# Patient Record
Sex: Female | Born: 1970 | Race: White | Hispanic: No | Marital: Married | Smoking: Former smoker
Health system: Southern US, Community
[De-identification: ages and names within clinical notes are randomized; demographics above are authoritative.]

## PROBLEM LIST (undated history)

## (undated) DIAGNOSIS — F419 Anxiety disorder, unspecified: Secondary | ICD-10-CM

## (undated) DIAGNOSIS — F329 Major depressive disorder, single episode, unspecified: Secondary | ICD-10-CM

## (undated) DIAGNOSIS — E7800 Pure hypercholesterolemia, unspecified: Secondary | ICD-10-CM

## (undated) DIAGNOSIS — G4730 Sleep apnea, unspecified: Secondary | ICD-10-CM

## (undated) DIAGNOSIS — F32A Depression, unspecified: Secondary | ICD-10-CM

## (undated) HISTORY — DX: Anxiety disorder, unspecified: F41.9

## (undated) HISTORY — PX: TUBAL LIGATION: SHX77

## (undated) HISTORY — DX: Major depressive disorder, single episode, unspecified: F32.9

## (undated) HISTORY — DX: Depression, unspecified: F32.A

---

## 1999-03-20 ENCOUNTER — Encounter: Admission: RE | Admit: 1999-03-20 | Discharge: 1999-06-18 | Payer: Self-pay | Admitting: Obstetrics & Gynecology

## 1999-08-15 ENCOUNTER — Other Ambulatory Visit: Admission: RE | Admit: 1999-08-15 | Discharge: 1999-08-15 | Payer: Self-pay | Admitting: Internal Medicine

## 1999-10-27 ENCOUNTER — Other Ambulatory Visit: Admission: RE | Admit: 1999-10-27 | Discharge: 1999-10-27 | Payer: Self-pay | Admitting: Obstetrics & Gynecology

## 2000-04-12 ENCOUNTER — Inpatient Hospital Stay (HOSPITAL_COMMUNITY): Admission: AD | Admit: 2000-04-12 | Discharge: 2000-04-14 | Payer: Self-pay | Admitting: Obstetrics & Gynecology

## 2000-04-15 ENCOUNTER — Encounter: Admission: RE | Admit: 2000-04-15 | Discharge: 2000-06-14 | Payer: Self-pay | Admitting: Obstetrics & Gynecology

## 2000-06-02 ENCOUNTER — Other Ambulatory Visit: Admission: RE | Admit: 2000-06-02 | Discharge: 2000-06-02 | Payer: Self-pay | Admitting: Obstetrics & Gynecology

## 2000-07-13 ENCOUNTER — Encounter: Admission: RE | Admit: 2000-07-13 | Discharge: 2000-08-12 | Payer: Self-pay | Admitting: Obstetrics & Gynecology

## 2000-08-13 ENCOUNTER — Encounter: Admission: RE | Admit: 2000-08-13 | Discharge: 2000-09-12 | Payer: Self-pay | Admitting: Obstetrics & Gynecology

## 2000-10-13 ENCOUNTER — Encounter: Admission: RE | Admit: 2000-10-13 | Discharge: 2000-11-12 | Payer: Self-pay | Admitting: Obstetrics & Gynecology

## 2000-11-13 ENCOUNTER — Encounter: Admission: RE | Admit: 2000-11-13 | Discharge: 2000-12-13 | Payer: Self-pay | Admitting: Obstetrics & Gynecology

## 2001-11-08 ENCOUNTER — Other Ambulatory Visit: Admission: RE | Admit: 2001-11-08 | Discharge: 2001-11-08 | Payer: Self-pay | Admitting: Obstetrics & Gynecology

## 2001-12-30 ENCOUNTER — Encounter (INDEPENDENT_AMBULATORY_CARE_PROVIDER_SITE_OTHER): Payer: Self-pay

## 2001-12-30 ENCOUNTER — Ambulatory Visit (HOSPITAL_COMMUNITY): Admission: RE | Admit: 2001-12-30 | Discharge: 2001-12-30 | Payer: Self-pay | Admitting: Obstetrics & Gynecology

## 2002-12-06 ENCOUNTER — Other Ambulatory Visit: Admission: RE | Admit: 2002-12-06 | Discharge: 2002-12-06 | Payer: Self-pay | Admitting: Obstetrics & Gynecology

## 2006-07-16 ENCOUNTER — Inpatient Hospital Stay (HOSPITAL_COMMUNITY): Admission: AD | Admit: 2006-07-16 | Discharge: 2006-07-16 | Payer: Self-pay | Admitting: Obstetrics & Gynecology

## 2006-09-21 ENCOUNTER — Encounter: Admission: RE | Admit: 2006-09-21 | Discharge: 2006-09-21 | Payer: Self-pay | Admitting: Obstetrics & Gynecology

## 2006-11-04 ENCOUNTER — Inpatient Hospital Stay (HOSPITAL_COMMUNITY): Admission: AD | Admit: 2006-11-04 | Discharge: 2006-11-08 | Payer: Self-pay | Admitting: Obstetrics & Gynecology

## 2006-11-05 ENCOUNTER — Encounter (INDEPENDENT_AMBULATORY_CARE_PROVIDER_SITE_OTHER): Payer: Self-pay | Admitting: Obstetrics & Gynecology

## 2006-11-10 ENCOUNTER — Inpatient Hospital Stay (HOSPITAL_COMMUNITY): Admission: AD | Admit: 2006-11-10 | Discharge: 2006-11-10 | Payer: Self-pay | Admitting: *Deleted

## 2008-06-28 ENCOUNTER — Ambulatory Visit (HOSPITAL_COMMUNITY): Admission: RE | Admit: 2008-06-28 | Discharge: 2008-06-28 | Payer: Self-pay | Admitting: Obstetrics & Gynecology

## 2008-06-28 ENCOUNTER — Encounter (INDEPENDENT_AMBULATORY_CARE_PROVIDER_SITE_OTHER): Payer: Self-pay | Admitting: Obstetrics & Gynecology

## 2010-06-18 LAB — PREGNANCY, URINE: Preg Test, Ur: NEGATIVE

## 2010-06-18 LAB — CBC
HCT: 33.5 % — ABNORMAL LOW (ref 36.0–46.0)
Platelets: 548 10*3/uL — ABNORMAL HIGH (ref 150–400)
RDW: 15.1 % (ref 11.5–15.5)

## 2010-07-22 NOTE — Op Note (Signed)
NAME:  Amanda Gamble, Amanda Gamble NO.:  [id number]   MEDICAL RECORD NO.:  [id number]           PATIENT TYPE:   LOCATION:                                FACILITY:  [facility]   PHYSICIAN:  Genia Del, M.D.DATE OF BIRTH:  Feb 19, 1971   DATE OF PROCEDURE:  11/05/2006  DATE OF DISCHARGE:                               OPERATIVE REPORT   PREOPERATIVE DIAGNOSIS:  36 plus weeks with gestational diabetes  mellitus, failure to descend in second stage, probable cephalopelvic  dystocia, clinical chorioamnionitis, desire for bilateral tubal  sterilization.   POSTOPERATIVE DIAGNOSIS:  36 plus weeks with gestational diabetes  mellitus, failure to descend in second stage, probable cephalopelvic  dystocia, clinical chorioamnionitis, desire for bilateral tubal  sterilization.   PROCEDURE:  Urgent primary low transverse cesarean section and bilateral  tubal sterilization by modified Pomeroy procedure.   SURGEON:  Genia Del, M.D.   ASSISTANT:  None.   ANESTHESIOLOGIST:  Raul Del, M.D.   DESCRIPTION OF PROCEDURE:  Under epidural anesthesia, the patient is in  15 degrees left decubitus position, she is prepped with Betadine on the  abdominal, suprapubic, and vulvar areas and draped as usual.  The Foley  is already in place.  We verify the level of anesthesia and it is  adequate.  We infiltrate the subcutaneous tissue with Marcaine 0.25%  plain 10 mL at the site of the future Pfannenstiel incision.  We make a  Pfannenstiel incision with a scalpel and open the adipose tissue with  the electrocautery.  We then opened the aponeurosis transversely with  the electrocautery and Mayo scissors.  We separate the recti muscles  from the aponeurosis on the midline.  We then open the parietal  peritoneum longitudinally with Metzenbaum scissors.  We then introduce  the Alexis retractor easily. We have a good view of the lower uterine  segment.  We open the visceral peritoneum  transversely over the lower  uterine segment and reclined the bladder downward.   We make a low transverse hysterotomy with a scalpel extension on each  side with dressing scissors.  The amniotic fluid is clear.  The fetus is  in cephalic position.  Birth of a baby girl at 3:48.  The cord is  clamped and cut. The baby is suctioned and given to the neonatal team.  Apgars are 9 and 9.  A pH is done and comes back at 7.26.  Weight is 7  pounds 2 ounces.  We extract the placenta easily and it is sent for cord  blood banking and pathology.  We then make a uterine revision. The  Pitocin is started in the IV fluids.  The uterus contracts well.  We  closed the hysterotomy with a first locked running suture of 0 Vicryl, a  mattress suture with 0 Vicryl is done to reinforce and complete  hemostasis.  An X stitch is needed to complete hemostasis on the left  angle.   We then remove the Alexis retractor and proceed with the bilateral tubal  sterilization with a modified Pomeroy technique.  We start on the left  side and proceed the same way on the right side, opening a window in the  mesosalpinx with the electrocautery, passing a plain 0 free tie at about  2 cm of the cornua, we then tie another one about 1.5 cm from the first  one.  We cut the segment of tube inbetween the two and send it to  pathology.  We then used electrocautery to cauterize each end of the cut  tube. Hemostasis is adequate on both sides.  We then irrigate the  abdominopelvic cavity, verify hemostasis, it is adequate at all levels  after being completed on the recti muscles with the electrocautery.  We  then closed the aponeurosis with two half running sutures of 0 Vicryl.  We complete hemostasis on the adipose tissue with the electrocautery and  reapproximate the skin with staples.  The count of instruments and  sponges was complete x2.  The estimated blood loss was 800 mL. The  patient had received Unasyn just before the  C-section.  No complications  occurred.  The patient was brought to the recovery room in good stable  status.      Genia Del, M.D.  Electronically Signed     ML/MEDQ  D:  11/05/2006  T:  11/05/2006  Job:  [id number]

## 2010-07-22 NOTE — Op Note (Signed)
NAME:  Amanda Gamble, Amanda Gamble NO.:  [id number]   MEDICAL RECORD NO.:  [id number]          PATIENT TYPE:  AMB   LOCATION:  SDC                           FACILITY:  [facility]   PHYSICIAN:  Genia Del, M.D.DATE OF BIRTH:  03-Dec-1970   DATE OF PROCEDURE:  06/28/2008  DATE OF DISCHARGE:                               OPERATIVE REPORT   The patient came to [facility] for surgery on June 28, 2008.   PREOPERATIVE DIAGNOSES:  Heavy periods and thick endometrium.   POSTOPERATIVE DIAGNOSES:  Heavy periods and thick endometrium plus  synechiae.   PROCEDURE:  Hysteroscopy with dilatation and curettage.   SURGEON:  Genia Del, MD   ASSISTANT.:  None.   PROCEDURE IN DETAIL:  Under general anesthesia with endotracheal  intubation, the patient is in the lithotomy position.  She was prepped  with Betadine on the suprapubic vulvar and vaginal areas, and the  bladder was catheterized.  The patient was draped as usual.  The vaginal  exam revealed an anteverted uterus, normal volume, mobile, no adnexal  mass.  The speculum was inserted in the vagina.  The anterior lip of the  cervix was grasped with a tenaculum.  We made a paracervical block with  lidocaine 1%, a total of 20 mL at 4 and 8 o'clock.  We then proceeded  with hysterometry, which is at 10 cm.  We dilated the cervix with Hegar  dilators up to #21 without difficulty.  We then inserted the diagnostic  hysteroscope in the intrauterine cavity.  The whole intrauterine cavity  was visualized including both ostia.  The endometrium was thick and  irregular and synechiae were present at many locations, especially at  the fundus.  Small polyps were present, but no other endometrial lesion.  We therefore decided to proceed with a curettage of the endometrial  cavity.  The hysteroscope was removed.  A sharp curette was introduced  in the intrauterine cavity and a systematic curettage was done on all  endometrial walls,  anteriorly, posteriorly and on the sites.  Abundant  material was retrieved including probable endometrial polyps and  everything was sent for pathology.  We then go back with a diagnostic  hysteroscope.  Again, no lesion was seen except for the synechiae, and  hemostasis was adequate.  We, therefore, removed the hysteroscope,  removed the tenaculum on the anterior lip of the cervix, and removed the  speculum.   ESTIMATED BLOOD LOSS:  Minimal.   FLUID DEFICIT:  160 mL.   COMPLICATIONS:  None occurred and the patient was brought to recovery  room in good stable status.   Note that pictures were taken before and after the endometrial  curettage.      Genia Del, M.D.  Electronically Signed     ML/MEDQ  D:  06/28/2008  T:  06/28/2008  Job:  [id number]

## 2010-07-25 NOTE — Op Note (Signed)
NAME:  Amanda Gamble, Amanda Gamble                         ACCOUNT NO.:  [id number]   MEDICAL RECORD NO.:  [id number]                   PATIENT TYPE:  AMB   LOCATION:  SDC                                  [facility]ACILITY:  [facility]   PHYSICIAN:  Genia Del, M.D.             DATE OF BIRTH:  1970-08-21   DATE OF PROCEDURE:  12/30/2001  DATE OF DISCHARGE:                                 OPERATIVE REPORT   REASON FOR ADMISSION:  The patient came at [facility], birthing room  as an outpatient surgery on December 30, 2001 as operative protocol.   PREOPERATIVE DIAGNOSES:  Eight week gestation, nondesired, blood group A-  negative.   POSTOPERATIVE DIAGNOSES:  Eight week gestation, nondesired, blood group A-  negative.   INTERVENTION:  Dilatation and evacuation.   SURGEON:  Genia Del, M.D.   ANESTHESIOLOGIST:  Quillian Quince, M.D.   PROCEDURE:  Under MAC analgesia, the patient was in lithotomy position.  She  was prepped with Betadine on the suprapubic, vulva, and vaginal areas.  The  bladder was catheterized and the patient was draped as usual.  The vaginal  examination revealed an anteverted uterus, cervix was closed, no bleeding,  no adnexal mass, and the uterus corresponds to about eight weeks.  The  speculum was inserted.  The paracervical block was done with lidocaine 1%, a  total of 20 cc at 4 o'clock and 8 o'clock.  The internal lip of the cervix  was grasped with a tenaculum.  Dilatation of the cervix was done with Hagar  dilators up to #29 without difficulty.  A curved #9 curet was used for  suction.  Products of conception corresponding to about eight weeks were  removed and sent to pathology.  A sharp curet was then used to  systematically curettage the entire intrauterine cavity.  The uterine sound  is heard on all surfaces and the uterus was contracting well.  The suction  curet was used once more to assure complete removal of any products of  conception or blood clots.   The uterus was contracting very well once more.  The instruments were removed.  Hemostasis was adequate.  The vaginal  examination revealed a well-contracted anteverted uterus.  The estimated  blood loss was less than 60 cc.  No complications occurred and the patient  was transferred to the recovery room in good status.   DISPOSITION:  Note that her blood group is A-negative and RhoGAM will be  given.  The patient will start Triphasil birth control pills.  Usage and  risks have been previously discussed with the patient at [facility] office.                                               Genia Del, M.D.  ML/MEDQ  D:  12/30/2001  T:  12/31/2001  Job:  [id number]

## 2010-07-25 NOTE — Discharge Summary (Signed)
NAMEALLYE, HOYOS               ACCOUNT NO.:  [id number]   MEDICAL RECORD NO.:  [id number]          PATIENT TYPE:  INP   LOCATION:  9102                          FACILITY:  [facility]   PHYSICIAN:  Genia Del, M.D.DATE OF BIRTH:  [birth year]   DATE OF ADMISSION:  11/04/2006  DATE OF DISCHARGE:  11/08/2006                               DISCHARGE SUMMARY   ADMISSION DIAGNOSIS:  1. A 36+ weeks' gestation.  2. Gestational diabetes mellitus  3. Failure to descend in labor.  4. Desire for bilateral tubal sterilization.   HOSPITAL COURSE:  The patient had an urgent primary low-transverse C-  section and bilateral tubal sterilization on November 05 2006.  She had a  baby girl at 3:48.  Apgars were 9 and 9, pH was 7.26.  Weight was 7  pounds 2 ounces.  The estimated blood loss was 800 mL.  The postop  evolution was normal.  The patient remained hemodynamically stable and  afebrile.  Her postop hemoglobin was 7.3.  She was given iron sulfate.  She was discharged on postop day #3 in good stable status.  Postop  instructions were given.  Tylox and Motrin were prescribed p.r.n.  The  staples will be removed at [facility] office.  Iron sulfate b.i.d.  was prescribed.      Genia Del, M.D.  Electronically Signed     ML/MEDQ  D:  12/16/2006  T:  12/17/2006  Job:  [id number]

## 2010-07-25 NOTE — H&P (Signed)
[facility]  Patient:    Amanda Gamble, Amanda Gamble             MRN: [id number] Adm. Date:  [id number] Attending:  Genia Del                         History and Physical  DATE OF BIRTH:                [birth year]  [name] is a 40 year old, G1, [redacted] weeks gestation, expected status of May 23, 2000.  REASON FOR ADMISSION:         Preterm rupture of membranes with clear fluid at 0030 this a.m. April 12, 2000.  HISTORY OF PRESENT ILLNESS:   She started feeling leakage of clear fluid at 0030 this a.m. She was having then irregular mild uterine contractions, no vaginal bleeding except for a slight pink tinge to her secretions. Good fetal movement, no PIH symptoms.  PAST MEDICAL HISTORY:         Negative.  PAST SURGICAL HISTORY:        Negative.  ALLERGIES:                    No known drug allergies.  MEDICATIONS:                  Prenatal vitamins.  SOCIAL HISTORY:               Married, nonsmoker.  FAMILY HISTORY:               Positive for ovarian cancer, mother. Breast cancer in paternal aunt.  HISTORY OF PRESENT PREGNANCY: First trimester was unremarkable. Labs showed hemoglobin of 13.5, platelets 362. Blood type A-, Rh antibodies negative. RPR nonreactive. HBsAg negative. HIV nonreactive. Rubella titer immune. Pap test was within normal limits, August 2001. Gonorrhea and chlamydia were negative, August 2001. In the second trimester, she had a triple test within normal limits. At 20 plus weeks, she had an ultrasound review of anatomy that was within normal limits. Amniotic fluid was within normal limits. Placenta was anterior normal and pelvis was 5.3 cm long, cloaked. At 28 plus weeks, she received RhoGAM. Her one hour GTT was abnormal, a three hour GTT was done and came back abnormal. She was therefore seen by the nutritionist for gestational diabetes which was so far well controlled with diet. Her blood  pressures remained normal throughout pregnancy.  REVIEW OF SYSTEMS:            CONSTITUTIONAL:  Negative.  HEENT:  Negative. RESPIRATORY: Negative. CARDIOVASCULAR:  Negative.  GI:  Negative.  UROLOGIC: Negative.  NEUROLOGIC:  Negative. DERMATOLOGIC:  Negative.  PHYSICAL EXAMINATION:  GENERAL:                      No apparent distress.  VITAL SIGNS:                  Blood pressure on admission was 127/80, pulse 76, temperature 97.1, pulse 76.  HEENT:                        Within normal limits.  LUNGS:                        Clear bilaterally.  HEART:  Regular cardiac rhythm, no murmur.  ABDOMEN:                      Gravid. Uterine height ______. Vertex presentation. Amniotic fluid present, fern positive.  PELVIC:                       Vaginal exam by nurse 2 cm dilated, 8% effaced, vertex -1/6.  EXTREMITIES:                  Lower limbs mild edema.  Fetal heart rate monitoring baseline 130-135 per minute, good variability, no ______. Uterine contractions every 2-3 minutes, mild.  IMPRESSION:                   G1, [redacted] weeks gestation, preterm rupture of membranes probably entering labor. Fetal well-being ______ group B strep unknown. This patient with diabetes mellitus on diet.  PLAN:                         Admit to labor and delivery, continuous monitoring. Penicillin G protocol. Betamethasone one dose now and a second dose in 12 hours if not delivered. Neonatal team consult later this morning. Above plans discussed with the patient and husband and agreed on risks associated with preterm rupture of membrane reviewed, pros and cons of attempting ______ reviewed and decision made to have an expectant management. DD:  04/12/00 TD:  04/12/00 Job: [id number]

## 2010-09-15 ENCOUNTER — Other Ambulatory Visit: Payer: Self-pay | Admitting: Obstetrics & Gynecology

## 2010-12-19 LAB — URINALYSIS, ROUTINE W REFLEX MICROSCOPIC
Bilirubin Urine: NEGATIVE
Protein, ur: NEGATIVE
Urobilinogen, UA: 0.2

## 2010-12-19 LAB — CBC
HCT: 23.5 — ABNORMAL LOW
HCT: 22.1 — ABNORMAL LOW
Hemoglobin: 11.1 — ABNORMAL LOW
Hemoglobin: 7.6 — CL
MCV: 82.8
MCV: 82.9
MCV: 83.7
Platelets: 270
RBC: 2.83 — ABNORMAL LOW
RBC: 2.6 — ABNORMAL LOW
RBC: 3.91
RDW: 14.3 — ABNORMAL HIGH
WBC: 9.7
WBC: 15.9 — ABNORMAL HIGH
WBC: 16.9 — ABNORMAL HIGH

## 2010-12-19 LAB — COMPREHENSIVE METABOLIC PANEL
BUN: 10
CO2: 23
Chloride: 108
Creatinine, Ser: 0.77
GFR calc non Af Amer: >60
Glucose, Bld: 129 — ABNORMAL HIGH
Total Bilirubin: 0.1 — ABNORMAL LOW

## 2010-12-19 LAB — DIFFERENTIAL
Basophils Absolute: 0
Eosinophils Relative: 4
Lymphocytes Relative: 21
Neutro Abs: 6.7

## 2010-12-19 LAB — GLUCOSE, RANDOM: Glucose, Bld: 118 — ABNORMAL HIGH

## 2011-07-24 ENCOUNTER — Other Ambulatory Visit (HOSPITAL_COMMUNITY): Payer: Self-pay | Admitting: Chiropractic Medicine

## 2011-07-24 ENCOUNTER — Ambulatory Visit (HOSPITAL_COMMUNITY)
Admission: RE | Admit: 2011-07-24 | Discharge: 2011-07-24 | Disposition: A | Discharge status: Home / Self Care | Payer: PRIVATE HEALTH INSURANCE | Source: Ambulatory Visit | Attending: Chiropractic Medicine | Admitting: Chiropractic Medicine

## 2011-07-24 DIAGNOSIS — M545 Low back pain, unspecified: Secondary | ICD-10-CM | POA: Insufficient documentation

## 2011-07-24 DIAGNOSIS — M549 Dorsalgia, unspecified: Secondary | ICD-10-CM

## 2011-07-24 DIAGNOSIS — M51379 Other intervertebral disc degeneration, lumbosacral region without mention of lumbar back pain or lower extremity pain: Secondary | ICD-10-CM | POA: Insufficient documentation

## 2011-07-24 DIAGNOSIS — M5137 Other intervertebral disc degeneration, lumbosacral region: Secondary | ICD-10-CM | POA: Insufficient documentation

## 2012-11-17 ENCOUNTER — Encounter (HOSPITAL_COMMUNITY): Payer: Self-pay | Admitting: Emergency Medicine

## 2012-11-17 ENCOUNTER — Emergency Department (HOSPITAL_COMMUNITY)
Admission: EM | Admit: 2012-11-17 | Discharge: 2012-11-17 | Disposition: A | Discharge status: Home / Self Care | Payer: PRIVATE HEALTH INSURANCE | Attending: Emergency Medicine | Admitting: Emergency Medicine

## 2012-11-17 DIAGNOSIS — R059 Cough, unspecified: Secondary | ICD-10-CM | POA: Insufficient documentation

## 2012-11-17 DIAGNOSIS — J3489 Other specified disorders of nose and nasal sinuses: Secondary | ICD-10-CM | POA: Insufficient documentation

## 2012-11-17 DIAGNOSIS — H8110 Benign paroxysmal vertigo, unspecified ear: Secondary | ICD-10-CM | POA: Insufficient documentation

## 2012-11-17 DIAGNOSIS — R05 Cough: Secondary | ICD-10-CM | POA: Insufficient documentation

## 2012-11-17 MED ORDER — PROMETHAZINE HCL 25 MG PO TABS: 25.0000 mg | ORAL_TABLET | Freq: Four times a day (QID) | ORAL | Status: DC | PRN

## 2012-11-17 MED ORDER — ONDANSETRON 4 MG PO TBDP
4.0000 mg | ORAL_TABLET | Freq: Once | ORAL | Status: AC
  Administered 2012-11-17: 4 mg via ORAL
  Filled 2012-11-17: qty 1

## 2012-11-17 MED ORDER — MECLIZINE HCL 25 MG PO TABS: 25.0000 mg | ORAL_TABLET | Freq: Three times a day (TID) | ORAL | Status: DC | PRN

## 2012-11-17 MED ORDER — MECLIZINE HCL 25 MG PO TABS
25.0000 mg | ORAL_TABLET | Freq: Once | ORAL | Status: AC
  Administered 2012-11-17: 25 mg via ORAL
  Filled 2012-11-17: qty 1

## 2012-11-17 NOTE — ED Provider Notes (Signed)
TIME SEEN: 9:09 AM  CHIEF COMPLAINT: Vertigo, cough, congestion  HPI: Patient is a 42 year old female with no significant past medical history who presents the emergency department with complaints of one week of intermittent vertigo, nasal congestion and dry cough. She reports that her vertigo is not persistent it is worse with head movement and change of position. She denies any hearing loss, tinnitus, ear pain or drainage. No fever. No recent head injury. No other numbness, tingling or focal weakness. She's never had similar symptoms.  ROS: See HPI Constitutional: no fever  Eyes: no drainage  ENT: no runny nose   Cardiovascular:  no chest pain  Resp: no SOB  GI: no vomiting GU: no dysuria Integumentary: no rash  Allergy: no hives  Musculoskeletal: no leg swelling  Neurological: no slurred speech ROS otherwise negative  PAST MEDICAL HISTORY/PAST SURGICAL HISTORY:  No past medical history on file.  MEDICATIONS:  Prior to Admission medications   Not on File    ALLERGIES:  Allergies not on file  SOCIAL HISTORY:  History  Substance Use Topics  . Smoking status: Not on file  . Smokeless tobacco: Not on file  . Alcohol Use: Not on file    FAMILY HISTORY: No family history on file.  EXAM: BP 138/75  Pulse 85  Temp(Src) 99.5 F (37.5 C) (Oral)  Resp 20  SpO2 96% CONSTITUTIONAL: Alert and oriented and responds appropriately to questions. Well-appearing; well-nourished HEAD: Normocephalic EYES: Conjunctivae clear, PERRL ENT: normal nose; no rhinorrhea; moist mucous membranes; pharynx without lesions noted, TMs are clear bilaterally NECK: Supple, no meningismus, no LAD  CARD: RRR; S1 and S2 appreciated; no murmurs, no clicks, no rubs, no gallops RESP: Normal chest excursion without splinting or tachypnea; breath sounds clear and equal bilaterally; no wheezes, no rhonchi, no rales ABD/GI: Normal bowel sounds; non-distended; soft, non-tender, no rebound, no guarding BACK:   The back appears normal and is non-tender to palpation, there is no CVA tenderness EXT: Normal ROM in all joints; non-tender to palpation; no edema; normal capillary refill; no cyanosis    SKIN: Normal color for age and race; warm NEURO: Moves all extremities equally, normal gait, strength 5/5 in all 4 extremities, sensation to light touch intact diffusely, no dysmetria to finger to nose, cranial nerves II through XII intact, patient does have a positive Dix-Hallpike test on the right PSYCH: The patient's mood and manner are appropriate. Grooming and personal hygiene are appropriate.  MEDICAL DECISION MAKING: Patient with benign positional vertigo. Attempted Epley's maneuver with minimal relief.  Will give meclizine and Zofran. Given ENT followup. Given return precautions. Instructed patient how to perform Epley's maneuver home. Patient verbalizes understanding is comfortable this plan. I am not concerned for any central cause for her vertigo as it is intermittent, associated with change in position including turning her head to the right, not associated with any other neurologic deficit, and no risk factors for stroke, no recent head injury and she is not on anticoagulation.     Layla Maw Ward, DO 11/17/12 [phone]

## 2012-11-17 NOTE — ED Notes (Signed)
Pt c/o dizziness for past couple of days.

## 2013-02-14 ENCOUNTER — Other Ambulatory Visit: Payer: Self-pay

## 2013-03-29 ENCOUNTER — Ambulatory Visit: Payer: Self-pay | Admitting: Gynecology

## 2013-10-05 ENCOUNTER — Encounter: Payer: Self-pay | Admitting: Gynecology

## 2013-10-05 ENCOUNTER — Ambulatory Visit (INDEPENDENT_AMBULATORY_CARE_PROVIDER_SITE_OTHER): Payer: Self-pay | Admitting: Gynecology

## 2013-10-05 ENCOUNTER — Other Ambulatory Visit (HOSPITAL_COMMUNITY)
Admission: RE | Admit: 2013-10-05 | Discharge: 2013-10-05 | Disposition: A | Discharge status: Home / Self Care | Payer: PRIVATE HEALTH INSURANCE | Source: Ambulatory Visit | Attending: Gynecology | Admitting: Gynecology

## 2013-10-05 VITALS — BP 124/76 | Ht 63.0 in | Wt 179.0 lb

## 2013-10-05 DIAGNOSIS — N926 Irregular menstruation, unspecified: Secondary | ICD-10-CM

## 2013-10-05 DIAGNOSIS — Z01419 Encounter for gynecological examination (general) (routine) without abnormal findings: Secondary | ICD-10-CM | POA: Diagnosis not present

## 2013-10-05 DIAGNOSIS — N951 Menopausal and female climacteric states: Secondary | ICD-10-CM

## 2013-10-05 NOTE — Addendum Note (Signed)
Addended by: Dayna BarkerGARDNER, Rook Maue K on: 10/05/2013 12:13 PM   Modules accepted: Orders

## 2013-10-05 NOTE — Progress Notes (Signed)
Amanda ChromanSuzanne M Gamble Feb 10, 1971 [id number]        43 y.o.  G2P2 new patient for annual exam.  Several issues noted below. Has not had a GYN exam in some time.  Past medical history,surgical history, problem list, medications, allergies, family history and social history were all reviewed and documented as reviewed in the EPIC chart.  ROS:  12 system ROS performed with pertinent positives and negatives included in the history, assessment and plan.   Additional significant findings :  None   Exam: [facility] personassistant Filed Vitals:   10/05/13 1129  BP: 124/76  Height: 5\' 3"  (1.6 m)  Weight: 179 lb (81.194 kg)   General appearance:  Normal affect, orientation and appearance. Skin: Grossly normal HEENT: Without gross lesions.  No cervical or supraclavicular adenopathy. Thyroid normal.  Lungs:  Clear without wheezing, rales or rhonchi Cardiac: RR, without RMG Abdominal:  Soft, nontender, without masses, guarding, rebound, organomegaly or hernia Breasts:  Examined lying and sitting without masses, retractions, discharge or axillary adenopathy. Pelvic:  Ext/BUS/vagina normal  Cervix normal. Pap done  Uterus anteverted, normal size, shape and contour, midline and mobile nontender   Adnexa  Without masses or tenderness    Anus and perineum  Normal   Rectovaginal  Normal sphincter tone without palpated masses or tenderness.    Assessment/Plan:  43 y.o. G2P2 female for annual exam with irregular menses, tubal sterilization.   1. Irregular menses/menopausal symptoms. Patient notes over the past year or 2 her menses have become more irregular where she will skip one or 2 months and then sometimes have a heavy bleed following. Last menstrual period April. Now over the last several months is now developing hot flushes and night sweats. No other symptoms such as weight changes hair changes or skin changes. No nipple discharge. We'll check baseline FSH TSH prolactin. Discussed various scenarios. It hormone  levels are normal will recommend progesterone withdrawal every 6 weeks if without spontaneous menses. If appears to be early menopause and discussed options of observation versus hormonal replacement to include low-dose oral contraceptives to provide both menstrual regulation and HRT versus straightforward HRT. She does have a smoking history but on questioning it was as a teenager she has not smoked for over 20 years. Risks of estrogen replacement reviewed to include stroke heart attack DVT possible breast cancer issues. We'll further discuss pending her lab results. She is not fasting and these labs will be placed as a future order and she'll return fasting to have drawn and we'll check her baseline lipid profile and glucose also at that time. 2. Mammography never. Recommended patient schedule a screening mammogram and she agrees to do so. Names and numbers provided. SBE monthly reviewed. 3. Pap smear 2011. Pap smear done today. No history of abnormal Pap smears previously. Assuming normal plan repeat at 3 year interval per current screening guidelines. 4. Health maintenance. Future orders placed for CBC comprehensive metabolic panel lipid profile TSH FSH prolactin. Patient will followup these lab results and then we'll discuss plan from there.   Note: This document was prepared with digital dictation and possible smart phrase technology. Any transcriptional errors that result from this process are unintentional.   Dara LordsFONTAINE,Kimaria Struthers P MD, 12:06 PM 10/05/2013

## 2013-10-05 NOTE — Patient Instructions (Addendum)
Follow up for your blood test results  Call to Schedule your mammogram  Facilities in [place]: 1)  [facility], [address] Phone: [phone] 2)  [facility]. [address] [place] [employer] Phone: [phone] 3)  Dr. Isaiah Blakes at [facility] [address] Phone: [phone]     Mammogram A mammogram is an X-ray test to find changes in a woman's breast. You should get a mammogram if:  You are 19 years of age or older  You have risk factors.   Your doctor recommends that you have one.  BEFORE THE TEST  Do not schedule the test the week before your period, especially if your breasts are sore during this time.  On the day of your mammogram:  Wash your breasts and armpits well. After washing, do not put on any deodorant or talcum powder on until after your test.   Eat and drink as you usually do.   Take your medicines as usual.   If you are diabetic and take insulin, make sure you:   Eat before coming for your test.   Take your insulin as usual.   If you cannot keep your appointment, call before the appointment to cancel. Schedule another appointment.  TEST  You will need to undress from the waist up. You will put on a hospital gown.   Your breast will be put on the mammogram machine, and it will press firmly on your breast with a piece of plastic called a compression paddle. This will make your breast flatter so that the machine can X-ray all parts of your breast.   Both breasts will be X-rayed. Each breast will be X-rayed from above and from the side. An X-ray might need to be taken again if the picture is not good enough.   The mammogram will last about 15 to 30 minutes.  AFTER THE TEST Finding out the results of your test Ask when your test results will be ready. Make sure you get your test results.  Document Released: 05/22/2008 Document Revised: 02/12/2011 Document  Reviewed: 05/22/2008 [facility] Patient Information 2012 [place].  You may obtain a copy of any labs that were done today by logging onto MyChart as outlined in the instructions provided with your AVS (after visit summary). The office will not call with normal lab results but certainly if there are any significant abnormalities then we will contact you.   Health Maintenance, Female A healthy lifestyle and preventative care can promote health and wellness.  Maintain regular health, dental, and eye exams.  Eat a healthy diet. Foods like vegetables, fruits, whole grains, low-fat dairy products, and lean protein foods contain the nutrients you need without too many calories. Decrease your intake of foods high in solid fats, added sugars, and salt. Get information about a proper diet from your caregiver, if necessary.  Regular physical exercise is one of the most important things you can do for your health. Most adults should get at least 150 minutes of moderate-intensity exercise (any activity that increases your heart rate and causes you to sweat) each week. In addition, most adults need muscle-strengthening exercises on 2 or more days a week.   Maintain a healthy weight. The body mass index (BMI) is a screening tool to identify possible weight problems. It provides an estimate of body fat based on height and weight. Your caregiver can help determine your BMI, and can help you  achieve or maintain a healthy weight. For adults 20 years and older:  A BMI below 18.5 is considered underweight.  A BMI of 18.5 to 24.9 is normal.  A BMI of 25 to 29.9 is considered overweight.  A BMI of 30 and above is considered obese.  Maintain normal blood lipids and cholesterol by exercising and minimizing your intake of saturated fat. Eat a balanced diet with plenty of fruits and vegetables. Blood tests for lipids and cholesterol should begin at age 100 and be repeated every 5 years. If your lipid or  cholesterol levels are high, you are over 50, or you are a high risk for heart disease, you may need your cholesterol levels checked more frequently.Ongoing high lipid and cholesterol levels should be treated with medicines if diet and exercise are not effective.  If you smoke, find out from your caregiver how to quit. If you do not use tobacco, do not start.  Lung cancer screening is recommended for adults aged 64 80 years who are at high risk for developing lung cancer because of a history of smoking. Yearly low-dose computed tomography (CT) is recommended for people who have at least a 30-pack-year history of smoking and are a current smoker or have quit within the past 15 years. A pack year of smoking is smoking an average of 1 pack of cigarettes a day for 1 year (for example: 1 pack a day for 30 years or 2 packs a day for 15 years). Yearly screening should continue until the smoker has stopped smoking for at least 15 years. Yearly screening should also be stopped for people who develop a health problem that would prevent them from having lung cancer treatment.  If you are pregnant, do not drink alcohol. If you are breastfeeding, be very cautious about drinking alcohol. If you are not pregnant and choose to drink alcohol, do not exceed 1 drink per day. One drink is considered to be 12 ounces (355 mL) of beer, 5 ounces (148 mL) of wine, or 1.5 ounces (44 mL) of liquor.  Avoid use of street drugs. Do not share needles with anyone. Ask for help if you need support or instructions about stopping the use of drugs.  High blood pressure causes heart disease and increases the risk of stroke. Blood pressure should be checked at least every 1 to 2 years. Ongoing high blood pressure should be treated with medicines, if weight loss and exercise are not effective.  If you are 2 to 44 years old, ask your caregiver if you should take aspirin to prevent strokes.  Diabetes screening involves taking a blood sample  to check your fasting blood sugar level. This should be done once every 3 years, after age 19, if you are within normal weight and without risk factors for diabetes. Testing should be considered at a younger age or be carried out more frequently if you are overweight and have at least 1 risk factor for diabetes.  Breast cancer screening is essential preventative care for women. You should practice "breast self-awareness." This means understanding the normal appearance and feel of your breasts and may include breast self-examination. Any changes detected, no matter how small, should be reported to a caregiver. Women in their 56s and 30s should have a clinical breast exam (CBE) by a caregiver as part of a regular health exam every 1 to 3 years. After age 51, women should have a CBE every year. Starting at age 46, women should consider having a mammogram (  breast X-ray) every year. Women who have a family history of breast cancer should talk to their caregiver about genetic screening. Women at a high risk of breast cancer should talk to their caregiver about having an MRI and a mammogram every year.  Breast cancer gene (BRCA)-related cancer risk assessment is recommended for women who have family members with BRCA-related cancers. BRCA-related cancers include breast, ovarian, tubal, and peritoneal cancers. Having family members with these cancers may be associated with an increased risk for harmful changes (mutations) in the breast cancer genes BRCA1 and BRCA2. Results of the assessment will determine the need for genetic counseling and BRCA1 and BRCA2 testing.  The Pap test is a screening test for cervical cancer. Women should have a Pap test starting at age 65. Between ages 10 and 33, Pap tests should be repeated every 2 years. Beginning at age 46, you should have a Pap test every 3 years as long as the past 3 Pap tests have been normal. If you had a hysterectomy for a problem that was not cancer or a condition  that could lead to cancer, then you no longer need Pap tests. If you are between ages 46 and 18, and you have had normal Pap tests going back 10 years, you no longer need Pap tests. If you have had past treatment for cervical cancer or a condition that could lead to cancer, you need Pap tests and screening for cancer for at least 20 years after your treatment. If Pap tests have been discontinued, risk factors (such as a new sexual partner) need to be reassessed to determine if screening should be resumed. Some women have medical problems that increase the chance of getting cervical cancer. In these cases, your caregiver may recommend more frequent screening and Pap tests.  The human papillomavirus (HPV) test is an additional test that may be used for cervical cancer screening. The HPV test looks for the virus that can cause the cell changes on the cervix. The cells collected during the Pap test can be tested for HPV. The HPV test could be used to screen women aged 73 years and older, and should be used in women of any age who have unclear Pap test results. After the age of 76, women should have HPV testing at the same frequency as a Pap test.  Colorectal cancer can be detected and often prevented. Most routine colorectal cancer screening begins at the age of 45 and continues through age 41. However, your caregiver may recommend screening at an earlier age if you have risk factors for colon cancer. On a yearly basis, your caregiver may provide home test kits to check for hidden blood in the stool. Use of a small camera at the end of a tube, to directly examine the colon (sigmoidoscopy or colonoscopy), can detect the earliest forms of colorectal cancer. Talk to your caregiver about this at age 38, when routine screening begins. Direct examination of the colon should be repeated every 5 to 10 years through age 23, unless early forms of pre-cancerous polyps or small growths are found.  Hepatitis C blood testing is  recommended for all people born from [birth year] through 1966 and any individual with known risks for hepatitis C.  Practice safe sex. Use condoms and avoid high-risk sexual practices to reduce the spread of sexually transmitted infections (STIs). Sexually active women aged 57 and younger should be checked for Chlamydia, which is a common sexually transmitted infection. Older women with new or multiple partners  should also be tested for Chlamydia. Testing for other STIs is recommended if you are sexually active and at increased risk.  Osteoporosis is a disease in which the bones lose minerals and strength with aging. This can result in serious bone fractures. The risk of osteoporosis can be identified using a bone density scan. Women ages 75 and over and women at risk for fractures or osteoporosis should discuss screening with their caregivers. Ask your caregiver whether you should be taking a calcium supplement or vitamin D to reduce the rate of osteoporosis.  Menopause can be associated with physical symptoms and risks. Hormone replacement therapy is available to decrease symptoms and risks. You should talk to your caregiver about whether hormone replacement therapy is right for you.  Use sunscreen. Apply sunscreen liberally and repeatedly throughout the day. You should seek shade when your shadow is shorter than you. Protect yourself by wearing long sleeves, pants, a wide-brimmed hat, and sunglasses year round, whenever you are outdoors.  Notify your caregiver of new moles or changes in moles, especially if there is a change in shape or color. Also notify your caregiver if a mole is larger than the size of a pencil eraser.  Stay current with your immunizations. Document Released: 09/08/2010 Document Revised: 06/20/2012 Document Reviewed: 09/08/2010 [facility] Patient Information 2014 [place].

## 2013-10-09 LAB — CYTOLOGY - PAP

## 2013-10-12 ENCOUNTER — Other Ambulatory Visit: Payer: Self-pay

## 2013-10-12 DIAGNOSIS — N926 Irregular menstruation, unspecified: Secondary | ICD-10-CM

## 2013-10-12 DIAGNOSIS — Z01419 Encounter for gynecological examination (general) (routine) without abnormal findings: Secondary | ICD-10-CM

## 2013-10-12 DIAGNOSIS — N951 Menopausal and female climacteric states: Secondary | ICD-10-CM

## 2013-10-12 LAB — CBC WITH DIFFERENTIAL/PLATELET
BASOS ABS: 0 10*3/uL (ref 0.0–0.1)
Basophils Relative: 0 % (ref 0–1)
Eosinophils Absolute: 0.4 10*3/uL (ref 0.0–0.7)
Eosinophils Relative: 4 % (ref 0–5)
HCT: 39.7 % (ref 36.0–46.0)
Hemoglobin: 13.8 g/dL (ref 12.0–15.0)
LYMPHS ABS: 2.8 10*3/uL (ref 0.7–4.0)
LYMPHS PCT: 30 % (ref 12–46)
MCH: 28.3 pg (ref 26.0–34.0)
MCHC: 34.8 g/dL (ref 30.0–36.0)
MCV: 81.4 fL (ref 78.0–100.0)
Monocytes Absolute: 0.5 10*3/uL (ref 0.1–1.0)
Monocytes Relative: 5 % (ref 3–12)
NEUTROS ABS: 5.7 10*3/uL (ref 1.7–7.7)
NEUTROS PCT: 61 % (ref 43–77)
PLATELETS: 382 10*3/uL (ref 150–400)
RBC: 4.88 MIL/uL (ref 3.87–5.11)
RDW: 13.8 % (ref 11.5–15.5)
WBC: 9.4 10*3/uL (ref 4.0–10.5)

## 2013-10-12 LAB — LIPID PANEL
Cholesterol: 196 mg/dL (ref 0–200)
HDL: 42 mg/dL (ref >39)
LDL Cholesterol: 108 mg/dL — ABNORMAL HIGH (ref 0–99)
Total CHOL/HDL Ratio: 4.7 Ratio
Triglycerides: 229 mg/dL — ABNORMAL HIGH (ref <150)
VLDL: 46 mg/dL — AB (ref 0–40)

## 2013-10-12 LAB — COMPREHENSIVE METABOLIC PANEL
ALBUMIN: 4.1 g/dL (ref 3.5–5.2)
ALT: 19 U/L (ref 0–35)
AST: 17 U/L (ref 0–37)
Alkaline Phosphatase: 62 U/L (ref 39–117)
BUN: 6 mg/dL (ref 6–23)
CALCIUM: 9.1 mg/dL (ref 8.4–10.5)
CHLORIDE: 104 meq/L (ref 96–112)
CO2: 24 mEq/L (ref 19–32)
CREATININE: 0.7 mg/dL (ref 0.50–1.10)
Glucose, Bld: 111 mg/dL — ABNORMAL HIGH (ref 70–99)
POTASSIUM: 4.2 meq/L (ref 3.5–5.3)
SODIUM: 137 meq/L (ref 135–145)
TOTAL PROTEIN: 6.6 g/dL (ref 6.0–8.3)
Total Bilirubin: 0.4 mg/dL (ref 0.2–1.2)

## 2013-10-12 LAB — PROLACTIN: PROLACTIN: 12.1 ng/mL

## 2013-10-12 LAB — TSH: TSH: 1.246 u[IU]/mL (ref 0.350–4.500)

## 2013-10-12 LAB — FOLLICLE STIMULATING HORMONE: FSH: 18.9 m[IU]/mL

## 2013-10-13 ENCOUNTER — Other Ambulatory Visit: Payer: Self-pay | Admitting: Gynecology

## 2013-10-13 DIAGNOSIS — R7309 Other abnormal glucose: Secondary | ICD-10-CM

## 2013-10-13 DIAGNOSIS — E7800 Pure hypercholesterolemia, unspecified: Secondary | ICD-10-CM

## 2013-10-13 LAB — URINALYSIS W MICROSCOPIC + REFLEX CULTURE
BILIRUBIN URINE: NEGATIVE
Bacteria, UA: NONE SEEN
Casts: NONE SEEN
Crystals: NONE SEEN
GLUCOSE, UA: NEGATIVE mg/dL
HGB URINE DIPSTICK: NEGATIVE
Ketones, ur: NEGATIVE mg/dL
Leukocytes, UA: NEGATIVE
Nitrite: NEGATIVE
PROTEIN: NEGATIVE mg/dL
Specific Gravity, Urine: 1.019 (ref 1.005–1.030)
UROBILINOGEN UA: 0.2 mg/dL (ref 0.0–1.0)
pH: 6 (ref 5.0–8.0)

## 2013-10-13 MED ORDER — MEDROXYPROGESTERONE ACETATE 10 MG PO TABS: 10.0000 mg | ORAL_TABLET | Freq: Every day | ORAL | Status: DC

## 2014-01-08 ENCOUNTER — Encounter: Payer: Self-pay | Admitting: Gynecology

## 2014-05-03 ENCOUNTER — Other Ambulatory Visit: Payer: Self-pay

## 2014-05-03 DIAGNOSIS — Z1231 Encounter for screening mammogram for malignant neoplasm of breast: Secondary | ICD-10-CM

## 2014-05-07 ENCOUNTER — Ambulatory Visit
Admission: RE | Admit: 2014-05-07 | Discharge: 2014-05-07 | Disposition: A | Discharge status: Home / Self Care | Payer: BLUE CROSS/BLUE SHIELD | Source: Ambulatory Visit

## 2014-05-07 DIAGNOSIS — Z1231 Encounter for screening mammogram for malignant neoplasm of breast: Secondary | ICD-10-CM

## 2014-05-15 ENCOUNTER — Telehealth: Payer: Self-pay | Admitting: *Deleted

## 2014-05-15 NOTE — Telephone Encounter (Signed)
Pt called requesting recent mammogram results 05/08/14. Pt informed normal.

## 2015-07-16 ENCOUNTER — Encounter (HOSPITAL_COMMUNITY): Payer: Self-pay | Admitting: Emergency Medicine

## 2015-07-16 ENCOUNTER — Emergency Department (HOSPITAL_COMMUNITY)
Admission: EM | Admit: 2015-07-16 | Discharge: 2015-07-16 | Disposition: A | Discharge status: Home / Self Care | Payer: PRIVATE HEALTH INSURANCE | Attending: Emergency Medicine | Admitting: Emergency Medicine

## 2015-07-16 DIAGNOSIS — R1013 Epigastric pain: Secondary | ICD-10-CM | POA: Diagnosis not present

## 2015-07-16 DIAGNOSIS — Z9851 Tubal ligation status: Secondary | ICD-10-CM | POA: Insufficient documentation

## 2015-07-16 DIAGNOSIS — Z8659 Personal history of other mental and behavioral disorders: Secondary | ICD-10-CM | POA: Insufficient documentation

## 2015-07-16 DIAGNOSIS — Z79899 Other long term (current) drug therapy: Secondary | ICD-10-CM | POA: Insufficient documentation

## 2015-07-16 DIAGNOSIS — Z87891 Personal history of nicotine dependence: Secondary | ICD-10-CM | POA: Insufficient documentation

## 2015-07-16 DIAGNOSIS — R1030 Lower abdominal pain, unspecified: Secondary | ICD-10-CM | POA: Diagnosis not present

## 2015-07-16 DIAGNOSIS — R197 Diarrhea, unspecified: Secondary | ICD-10-CM | POA: Diagnosis not present

## 2015-07-16 DIAGNOSIS — Z3202 Encounter for pregnancy test, result negative: Secondary | ICD-10-CM | POA: Insufficient documentation

## 2015-07-16 DIAGNOSIS — Z566 Other physical and mental strain related to work: Secondary | ICD-10-CM

## 2015-07-16 DIAGNOSIS — F419 Anxiety disorder, unspecified: Secondary | ICD-10-CM

## 2015-07-16 DIAGNOSIS — R1032 Left lower quadrant pain: Secondary | ICD-10-CM | POA: Diagnosis present

## 2015-07-16 LAB — URINALYSIS, ROUTINE W REFLEX MICROSCOPIC
Bilirubin Urine: NEGATIVE
Glucose, UA: NEGATIVE mg/dL
HGB URINE DIPSTICK: NEGATIVE
Ketones, ur: NEGATIVE mg/dL
LEUKOCYTES UA: NEGATIVE
Nitrite: NEGATIVE
PH: 5.5 (ref 5.0–8.0)
PROTEIN: NEGATIVE mg/dL
Specific Gravity, Urine: 1.036 — ABNORMAL HIGH (ref 1.005–1.030)

## 2015-07-16 LAB — DIFFERENTIAL
BASOS ABS: 0 10*3/uL (ref 0.0–0.1)
BASOS PCT: 0 %
EOS PCT: 18 %
Eosinophils Absolute: 2.5 10*3/uL — ABNORMAL HIGH (ref 0.0–0.7)
Lymphocytes Relative: 29 %
Lymphs Abs: 4.1 10*3/uL — ABNORMAL HIGH (ref 0.7–4.0)
Monocytes Absolute: 0.5 10*3/uL (ref 0.1–1.0)
Monocytes Relative: 4 %
Neutro Abs: 6.9 10*3/uL (ref 1.7–7.7)
Neutrophils Relative %: 49 %

## 2015-07-16 LAB — COMPREHENSIVE METABOLIC PANEL
ALBUMIN: 4.4 g/dL (ref 3.5–5.0)
ALK PHOS: 65 U/L (ref 38–126)
ALT: 23 U/L (ref 14–54)
AST: 23 U/L (ref 15–41)
Anion gap: 8 (ref 5–15)
BUN: 10 mg/dL (ref 6–20)
CALCIUM: 9.9 mg/dL (ref 8.9–10.3)
CO2: 27 mmol/L (ref 22–32)
CREATININE: 0.76 mg/dL (ref 0.44–1.00)
Chloride: 106 mmol/L (ref 101–111)
GFR calc Af Amer: >60 mL/min (ref >60)
GLUCOSE: 91 mg/dL (ref 65–99)
Potassium: 4.7 mmol/L (ref 3.5–5.1)
Sodium: 141 mmol/L (ref 135–145)
TOTAL PROTEIN: 7.8 g/dL (ref 6.5–8.1)
Total Bilirubin: 0.9 mg/dL (ref 0.3–1.2)

## 2015-07-16 LAB — CBC
HEMATOCRIT: 44.5 % (ref 36.0–46.0)
Hemoglobin: 14.9 g/dL (ref 12.0–15.0)
MCH: 29.3 pg (ref 26.0–34.0)
MCHC: 33.5 g/dL (ref 30.0–36.0)
MCV: 87.4 fL (ref 78.0–100.0)
Platelets: 346 10*3/uL (ref 150–400)
RBC: 5.09 MIL/uL (ref 3.87–5.11)
RDW: 14.1 % (ref 11.5–15.5)
WBC: 14.3 10*3/uL — AB (ref 4.0–10.5)

## 2015-07-16 LAB — LIPASE, BLOOD: Lipase: 34 U/L (ref 11–51)

## 2015-07-16 LAB — I-STAT BETA HCG BLOOD, ED (MC, WL, AP ONLY)

## 2015-07-16 MED ORDER — ACETAMINOPHEN 325 MG PO TABS
650.0000 mg | ORAL_TABLET | Freq: Once | ORAL | Status: AC
  Administered 2015-07-16: 650 mg via ORAL
  Filled 2015-07-16: qty 2

## 2015-07-16 NOTE — ED Notes (Signed)
Pt states that she has been having gen abd pain x 2 wks.  Worse in morning and worse after she eats.  Endorses diarrhea but no NV.

## 2015-07-16 NOTE — Discharge Instructions (Signed)
Abdominal (belly) pain can be caused by many things. Your symptoms seem consistent with irritable bowel syndrome, but this is a diagnosis of exclusion so further testing would need to be performed before you were formally diagnosed with this.  Your caregiver performed an examination and possibly ordered blood/urine tests and imaging (CT scan, x-rays, ultrasound). Many cases can be observed and treated at home after initial evaluation in the emergency department. Even though you are being discharged home, abdominal pain can be unpredictable. Therefore, you need a repeated exam if your pain does not resolve, returns, or worsens. Most patients with abdominal pain don't have to be admitted to the hospital or have surgery, but serious problems like appendicitis and gallbladder attacks can start out as nonspecific pain. Many abdominal conditions cannot be diagnosed in one visit, so follow-up evaluations are very important. Follow up with the gastroenterologist in 1-2 weeks for ongoing management of your symptoms. May consider taking over the counter Tums, maalox, or zantac as needed for symptom relief. Use the BRAT diet to help with diarrhea (listed below), and increase fiber/water intake to help with this.  SEEK IMMEDIATE MEDICAL ATTENTION IF YOU DEVELOP ANY OF THE FOLLOWING SYMPTOMS:  The pain does not go away or becomes severe.   A temperature above 101 develops.   Repeated vomiting occurs (multiple episodes).   The pain becomes localized to portions of the abdomen. The right side could possibly be appendicitis. In an adult, the left lower portion of the abdomen could be colitis or diverticulitis.   Blood is being passed in stools or vomit (bright red or black tarry stools).   Return also if you develop chest pain, difficulty breathing, dizziness or fainting, or become confused, poorly responsive, or inconsolable (young children).  The constipation stays for more than 4 days.   There is belly  (abdominal) or rectal pain.   You do not seem to be getting better.    Abdominal Pain, Adult Many things can cause belly (abdominal) pain. Most times, the belly pain is not dangerous. Many cases of belly pain can be watched and treated at home. HOME CARE   Do not take medicines that help you go poop (laxatives) unless told to by your doctor.  Only take medicine as told by your doctor.  Eat or drink as told by your doctor. Your doctor will tell you if you should be on a special diet. GET HELP IF:  You do not know what is causing your belly pain.  You have belly pain while you are sick to your stomach (nauseous) or have runny poop (diarrhea).  You have pain while you pee or poop.  Your belly pain wakes you up at night.  You have belly pain that gets worse or better when you eat.  You have belly pain that gets worse when you eat fatty foods.  You have a fever. GET HELP RIGHT AWAY IF:   The pain does not go away within 2 hours.  You keep throwing up (vomiting).  The pain changes and is only in the right or left part of the belly.  You have bloody or tarry looking poop. MAKE SURE YOU:   Understand these instructions.  Will watch your condition.  Will get help right away if you are not doing well or get worse.   This information is not intended to replace advice given to you by your health care provider. Make sure you discuss any questions you have with your health care provider.  Document Released: 08/12/2007 Document Revised: 03/16/2014 Document Reviewed: 11/02/2012 Elsevier Interactive Patient Education 2016 [place].   Diarrhea Diarrhea is frequent loose and watery bowel movements. It can cause you to feel weak and dehydrated. Dehydration can cause you to become tired and thirsty, have a dry mouth, and have decreased urination that often is dark yellow. Diarrhea is a sign of another problem, most often an infection that will not last long. In most cases,  diarrhea typically lasts 2-3 days. However, it can last longer if it is a sign of something more serious. It is important to treat your diarrhea as directed by your caregiver to lessen or prevent future episodes of diarrhea. CAUSES  Some common causes include:  Gastrointestinal infections caused by viruses, bacteria, or parasites.  Food poisoning or food allergies.  Certain medicines, such as antibiotics, chemotherapy, and laxatives.  Artificial sweeteners and fructose.  Digestive disorders. HOME CARE INSTRUCTIONS  Ensure adequate fluid intake (hydration): Have 1 cup (8 oz) of fluid for each diarrhea episode. Avoid fluids that contain simple sugars or sports drinks, fruit juices, whole milk products, and sodas. Your urine should be clear or pale yellow if you are drinking enough fluids. Hydrate with an oral rehydration solution that you can purchase at pharmacies, retail stores, and online. You can prepare an oral rehydration solution at home by mixing the following ingredients together:   - tsp table salt.   tsp baking soda.   tsp salt substitute containing potassium chloride.  1  tablespoons sugar.  1 L (34 oz) of water.  Certain foods and beverages may increase the speed at which food moves through the gastrointestinal (GI) tract. These foods and beverages should be avoided and include:  Caffeinated and alcoholic beverages.  High-fiber foods, such as raw fruits and vegetables, nuts, seeds, and whole grain breads and cereals.  Foods and beverages sweetened with sugar alcohols, such as xylitol, sorbitol, and mannitol.  Some foods may be well tolerated and may help thicken stool including:  Starchy foods, such as rice, toast, pasta, low-sugar cereal, oatmeal, grits, baked potatoes, crackers, and bagels.  Bananas.  Applesauce.  Add probiotic-rich foods to help increase healthy bacteria in the GI tract, such as yogurt and fermented milk products.  Wash your hands well  after each diarrhea episode.  Only take over-the-counter or prescription medicines as directed by your caregiver.  Take a warm bath to relieve any burning or pain from frequent diarrhea episodes. SEEK IMMEDIATE MEDICAL CARE IF:   You are unable to keep fluids down.  You have persistent vomiting.  You have blood in your stool, or your stools are black and tarry.  You do not urinate in 6-8 hours, or there is only a small amount of very dark urine.  You have abdominal pain that increases or localizes.  You have weakness, dizziness, confusion, or light-headedness.  You have a severe headache.  Your diarrhea gets worse or does not get better.  You have a fever or persistent symptoms for more than 2-3 days.  You have a fever and your symptoms suddenly get worse. MAKE SURE YOU:   Understand these instructions.  Will watch your condition.  Will get help right away if you are not doing well or get worse.   This information is not intended to replace advice given to you by your health care provider. Make sure you discuss any questions you have with your health care provider.   Document Released: 02/13/2002 Document Revised: 03/16/2014 Document Reviewed: 11/01/2011  [employer] certified [employer] Patient Education 2016 [place] Choices to Help Relieve Diarrhea, Adult When you have diarrhea, the foods you eat and your eating habits are very important. Choosing the right foods and drinks can help relieve diarrhea. Also, because diarrhea can last up to 7 days, you need to replace lost fluids and electrolytes (such as sodium, potassium, and chloride) in order to help prevent dehydration.  WHAT GENERAL GUIDELINES DO I NEED TO FOLLOW?  Slowly drink 1 cup (8 oz) of fluid for each episode of diarrhea. If you are getting enough fluid, your urine will be clear or pale yellow.  Eat starchy foods. Some good choices include white rice, white toast, pasta, low-fiber cereal, baked potatoes  (without the skin), saltine crackers, and bagels.  Avoid large servings of any cooked vegetables.  Limit fruit to two servings per day. A serving is  cup or 1 small piece.  Choose foods with less than 2 g of fiber per serving.  Limit fats to less than 8 tsp (38 g) per day.  Avoid fried foods.  Eat foods that have probiotics in them. Probiotics can be found in certain dairy products.  Avoid foods and beverages that may increase the speed at which food moves through the stomach and intestines (gastrointestinal tract). Things to avoid include:  High-fiber foods, such as dried fruit, raw fruits and vegetables, nuts, seeds, and whole grain foods.  Spicy foods and high-fat foods.  Foods and beverages sweetened with high-fructose corn syrup, honey, or sugar alcohols such as xylitol, sorbitol, and mannitol. WHAT FOODS ARE RECOMMENDED? Grains White rice. White, [place], or pita breads (fresh or toasted), including plain rolls, buns, or bagels. White pasta. Saltine, soda, or graham crackers. Pretzels. Low-fiber cereal. Cooked cereals made with water (such as cornmeal, farina, or cream cereals). Plain muffins. Matzo. Melba toast. Zwieback.  Vegetables Potatoes (without the skin). Strained tomato and vegetable juices. Most well-cooked and canned vegetables without seeds. Tender lettuce. Fruits Cooked or canned applesauce, apricots, cherries, fruit cocktail, grapefruit, peaches, pears, or plums. Fresh bananas, apples without skin, cherries, grapes, cantaloupe, grapefruit, peaches, oranges, or plums.  Meat and Other Protein Products Baked or boiled chicken. Eggs. Tofu. Fish. Seafood. Smooth peanut butter. Ground or well-cooked tender beef, ham, veal, lamb, pork, or poultry.  Dairy Plain yogurt, kefir, and unsweetened liquid yogurt. Lactose-free milk, buttermilk, or soy milk. Plain hard cheese. Beverages Sport drinks. Clear broths. Diluted fruit juices (except prune). Regular, caffeine-free sodas  such as ginger ale. Water. Decaffeinated teas. Oral rehydration solutions. Sugar-free beverages not sweetened with sugar alcohols. Other Bouillon, broth, or soups made from recommended foods.  The items listed above may not be a complete list of recommended foods or beverages. Contact your dietitian for more options. WHAT FOODS ARE NOT RECOMMENDED? Grains Whole grain, whole wheat, bran, or rye breads, rolls, pastas, crackers, and cereals. Wild or brown rice. Cereals that contain more than 2 g of fiber per serving. Corn tortillas or taco shells. Cooked or dry oatmeal. Granola. Popcorn. Vegetables Raw vegetables. Cabbage, broccoli, Brussels sprouts, artichokes, baked beans, beet greens, corn, kale, legumes, peas, sweet potatoes, and yams. Potato skins. Cooked spinach and cabbage. Fruits Dried fruit, including raisins and dates. Raw fruits. Stewed or dried prunes. Fresh apples with skin, apricots, mangoes, pears, raspberries, and strawberries.  Meat and Other Protein Products Chunky peanut butter. Nuts and seeds. Beans and lentils. Berniece Salines.  Dairy High-fat cheeses. Milk, chocolate milk, and beverages made with milk, such as milk shakes. Cream. Ice cream.  Sweets and Desserts Sweet rolls, doughnuts, and sweet breads. Pancakes and waffles. Fats and Oils Butter. Cream sauces. Margarine. Salad oils. Plain salad dressings. Olives. Avocados.  Beverages Caffeinated beverages (such as coffee, tea, soda, or energy drinks). Alcoholic beverages. Fruit juices with pulp. Prune juice. Soft drinks sweetened with high-fructose corn syrup or sugar alcohols. Other Coconut. Hot sauce. Chili powder. Mayonnaise. Gravy. Cream-based or milk-based soups.  The items listed above may not be a complete list of foods and beverages to avoid. Contact your dietitian for more information. WHAT SHOULD I DO IF I BECOME DEHYDRATED? Diarrhea can sometimes lead to dehydration. Signs of dehydration include dark urine and dry mouth and  skin. If you think you are dehydrated, you should rehydrate with an oral rehydration solution. These solutions can be purchased at pharmacies, retail stores, or online.  Drink -1 cup (120-240 mL) of oral rehydration solution each time you have an episode of diarrhea. If drinking this amount makes your diarrhea worse, try drinking smaller amounts more often. For example, drink 1-3 tsp (5-15 mL) every 5-10 minutes.  A general rule for staying hydrated is to drink 1-2 L of fluid per day. Talk to your health care provider about the specific amount you should be drinking each day. Drink enough fluids to keep your urine clear or pale yellow.   This information is not intended to replace advice given to you by your health care provider. Make sure you discuss any questions you have with your health care provider.   Document Released: 05/16/2003 Document Revised: 03/16/2014 Document Reviewed: 01/16/2013 Elsevier Interactive Patient Education 2016 [place] and Stress Management Stress is a normal reaction to life events. It is what you feel when life demands more than you are used to or more than you can handle. Some stress can be useful. For example, the stress reaction can help you catch the last bus of the day, study for a test, or meet a deadline at work. But stress that occurs too often or for too long can cause problems. It can affect your emotional health and interfere with relationships and normal daily activities. Too much stress can weaken your immune system and increase your risk for physical illness. If you already have a medical problem, stress can make it worse. CAUSES  All sorts of life events may cause stress. An event that causes stress for one person may not be stressful for another person. Major life events commonly cause stress. These may be positive or negative. Examples include losing your job, moving into a new home, getting married, having a baby, or losing a loved one. Less  obvious life events may also cause stress, especially if they occur day after day or in combination. Examples include working long hours, driving in traffic, caring for children, being in debt, or being in a difficult relationship. SIGNS AND SYMPTOMS Stress may cause emotional symptoms including, the following:  Anxiety. This is feeling worried, afraid, on edge, overwhelmed, or out of control.  Anger. This is feeling irritated or impatient.  Depression. This is feeling sad, down, helpless, or guilty.  Difficulty focusing, remembering, or making decisions. Stress may cause physical symptoms, including the following:   Aches and pains. These may affect your head, neck, back, stomach, or other areas of your body.  Tight muscles or clenched jaw.  Low energy or trouble sleeping. Stress may cause unhealthy behaviors, including the following:   Eating to feel better (overeating) or skipping meals.  Sleeping too little,  too much, or both.  Working too much or putting off tasks (procrastination).  Smoking, drinking alcohol, or using drugs to feel better. DIAGNOSIS  Stress is diagnosed through an assessment by your health care provider. Your health care provider will ask questions about your symptoms and any stressful life events.Your health care provider will also ask about your medical history and may order blood tests or other tests. Certain medical conditions and medicine can cause physical symptoms similar to stress. Mental illness can cause emotional symptoms and unhealthy behaviors similar to stress. Your health care provider may refer you to a mental health professional for further evaluation.  TREATMENT  Stress management is the recommended treatment for stress.The goals of stress management are reducing stressful life events and coping with stress in healthy ways.  Techniques for reducing stressful life events include the following:  Stress identification. Self-monitor for stress  and identify what causes stress for you. These skills may help you to avoid some stressful events.  Time management. Set your priorities, keep a calendar of events, and learn to say "no." These tools can help you avoid making too many commitments. Techniques for coping with stress include the following:  Rethinking the problem. Try to think realistically about stressful events rather than ignoring them or overreacting. Try to find the positives in a stressful situation rather than focusing on the negatives.  Exercise. Physical exercise can release both physical and emotional tension. The key is to find a form of exercise you enjoy and do it regularly.  Relaxation techniques. These relax the body and mind. Examples include yoga, meditation, tai chi, biofeedback, deep breathing, progressive muscle relaxation, listening to music, being out in nature, journaling, and other hobbies. Again, the key is to find one or more that you enjoy and can do regularly.  Healthy lifestyle. Eat a balanced diet, get plenty of sleep, and do not smoke. Avoid using alcohol or drugs to relax.  Strong support network. Spend time with family, friends, or other people you enjoy being around.Express your feelings and talk things over with someone you trust. Counseling or talktherapy with a mental health professional may be helpful if you are having difficulty managing stress on your own. Medicine is typically not recommended for the treatment of stress.Talk to your health care provider if you think you need medicine for symptoms of stress. HOME CARE INSTRUCTIONS  Keep all follow-up visits as directed by your health care provider.  Take all medicines as directed by your health care provider. SEEK MEDICAL CARE IF:  Your symptoms get worse or you start having new symptoms.  You feel overwhelmed by your problems and can no longer manage them on your own. SEEK IMMEDIATE MEDICAL CARE IF:  You feel like hurting yourself  or someone else.   This information is not intended to replace advice given to you by your health care provider. Make sure you discuss any questions you have with your health care provider.   Document Released: 08/19/2000 Document Revised: 03/16/2014 Document Reviewed: 10/18/2012 Elsevier Interactive Patient Education [employer].

## 2015-07-16 NOTE — ED Provider Notes (Signed)
CSN: [id number]     Arrival date & time 07/16/15  4098 History   First MD Initiated Contact with Patient 07/16/15 1232     Chief Complaint  Patient presents with  . Abdominal Pain     (Consider location/radiation/quality/duration/timing/severity/associated sxs/prior Treatment) HPI Comments: Amanda Gamble is a 45 y.o. female with a PMHx of anxiety and depression, with a PSHx of tubal ligation, who presents to the ED with complaints of gradual onset intermittent lower abdominal pain 2 weeks. She scrubs pain is 8/10 cramping intermittent lower abdominal/left lower quadrant pain, nonradiating, worse with eating and every morning when she wakes up, and improved with Tylenol and ibuprofen. Associated symptoms include diarrhea every time she eats, approximately 2-3 times daily of nonbloody watery stools. She does endorse increased stress recently and wonders whether this is related. She also endorses frequent NSAID use.  She denies any fevers, chills, chest pain, shortness breath, nausea, vomiting, constipation, obstipation, melena, hematochezia, dysuria, hematuria, vaginal bleeding or discharge, numbness, tingling, weakness, recent travel, sick contacts, suspicious food intake, frequent alcohol use, or recent antibiotics.  Patient is a 45 y.o. female presenting with abdominal pain. The history is provided by the patient. No language interpreter was used.  Abdominal Pain Pain location:  LLQ, RLQ and suprapubic Pain quality: cramping   Pain radiates to:  Does not radiate Pain severity:  Moderate Onset quality:  Gradual Duration:  2 weeks Timing:  Intermittent Progression:  Waxing and waning Chronicity:  New Context: not recent travel, not sick contacts and not suspicious food intake   Context comment:  +increased stress Relieved by:  Acetaminophen and NSAIDs Worsened by:  Eating Ineffective treatments:  None tried Associated symptoms: diarrhea   Associated symptoms: no chest pain, no chills,  no constipation, no dysuria, no fever, no flatus, no hematemesis, no hematochezia, no hematuria, no melena, no nausea, no shortness of breath, no vaginal bleeding, no vaginal discharge and no vomiting   Risk factors: NSAID use   Risk factors: no alcohol abuse and has not had multiple surgeries     Past Medical History  Diagnosis Date  . Depression   . Anxiety    Past Surgical History  Procedure Laterality Date  . Cesarean section    . Tubal ligation     Family History  Problem Relation Age of Onset  . Dementia Mother   . Ovarian cancer Mother   . Heart disease Father   . Breast cancer Paternal Aunt     34's  . Heart failure Maternal Grandmother   . Breast cancer Paternal Aunt     49's  . Breast cancer Paternal Aunt     74's   Social History  Substance Use Topics  . Smoking status: Former Games developer  . Smokeless tobacco: None  . Alcohol Use: Yes     Comment: Occas   OB History    Gravida Para Term Preterm AB TAB SAB Ectopic Multiple Living   Review of Systems  Constitutional: Negative for fever and chills.  Respiratory: Negative for shortness of breath.   Cardiovascular: Negative for chest pain.  Gastrointestinal: Positive for abdominal pain and diarrhea. Negative for nausea, vomiting, constipation, blood in stool, melena, hematochezia, flatus and hematemesis.  Genitourinary: Negative for dysuria, hematuria, vaginal bleeding and vaginal discharge.  Musculoskeletal: Negative for myalgias and arthralgias.  Skin: Negative for color change.  Allergic/Immunologic: Negative for immunocompromised state.  Neurological: Negative for weakness  and numbness.  Psychiatric/Behavioral: Negative for confusion.       +increased stress   10 Systems reviewed and are negative for acute change except as noted in the HPI.    Allergies  Review of patient's allergies indicates no known allergies.  Home Medications   Prior to Admission medications   Medication Sig  Start Date End Date Taking? Authorizing Provider  medroxyPROGESTERone (PROVERA) 10 MG tablet Take 1 tablet (10 mg total) by mouth daily. 10/13/13   Dara Lords, MD  Multiple Vitamin (MULTIVITAMIN WITH MINERALS) TABS tablet Take 1 tablet by mouth daily.    Historical Provider, MD   BP 142/85 mmHg  Pulse 85  Temp(Src) 98.1 F (36.7 C) (Oral)  Resp 18  SpO2 98%  LMP 06/10/2015 (Approximate) Physical Exam  Constitutional: She is oriented to person, place, and time. Vital signs are normal. She appears well-developed and well-nourished.  Non-toxic appearance. No distress.  Afebrile, nontoxic, NAD  HENT:  Head: Normocephalic and atraumatic.  Mouth/Throat: Oropharynx is clear and moist and mucous membranes are normal.  Eyes: Conjunctivae and EOM are normal. Right eye exhibits no discharge. Left eye exhibits no discharge.  Neck: Normal range of motion. Neck supple.  Cardiovascular: Normal rate, regular rhythm, normal heart sounds and intact distal pulses.  Exam reveals no gallop and no friction rub.   No murmur heard. Pulmonary/Chest: Effort normal and breath sounds normal. No respiratory distress. She has no decreased breath sounds. She has no wheezes. She has no rhonchi. She has no rales.  Abdominal: Soft. Normal appearance and bowel sounds are normal. She exhibits no distension. There is tenderness in the epigastric area and suprapubic area. There is no rigidity, no rebound, no guarding, no CVA tenderness, no tenderness at McBurney's point and negative Murphy's sign.    Soft, nondistended, +BS throughout, with mild epigastric and suprapubic TTP, no r/g/r, neg murphy's, neg mcburney's, no CVA TTP   Musculoskeletal: Normal range of motion.  Neurological: She is alert and oriented to person, place, and time. She has normal strength. No sensory deficit.  Skin: Skin is warm, dry and intact. No rash noted.  Psychiatric: She has a normal mood and affect.  Nursing note and vitals  reviewed.   ED Course  Procedures (including critical care time) Labs Review Labs Reviewed  CBC - Abnormal; Notable for the following:    WBC 14.3 (*)    All other components within normal limits  URINALYSIS, ROUTINE W REFLEX MICROSCOPIC (NOT AT [facility]) - Abnormal; Notable for the following:    Color, Urine AMBER (*)    APPearance CLOUDY (*)    Specific Gravity, Urine 1.036 (*)    All other components within normal limits  DIFFERENTIAL - Abnormal; Notable for the following:    Lymphs Abs 4.1 (*)    Eosinophils Absolute 2.5 (*)    All other components within normal limits  LIPASE, BLOOD  COMPREHENSIVE METABOLIC PANEL  I-STAT BETA HCG BLOOD, ED ([facility], [facility], [facility] ONLY)    Imaging Review No results found. I have personally reviewed and evaluated these images and lab results as part of my medical decision-making.   EKG Interpretation None      MDM   Final diagnoses:  Lower abdominal pain  Diarrhea, unspecified type  Anxiety  Stress at work    45 y.o. female here with 2 weeks of intermittent lower abdominal cramping and diarrhea. Endorses increased stress recently. On exam she has epigastric and suprapubic tenderness but no peritoneal signs or symptoms.  Seems likely that she has IBS, although this is a diagnosis of exclusion and therefore do not feel we can diagnose this today. Lab work done prior to exam shows beta hCG negative, lipase WNL, CMP WNL, and CBC with WBC 14.3 without differential done, will add this on now, but doubt that this leukocytosis is true (could be demargination vs hemoconcentration since her H/H is higher than it normally is). U/A not yet sent, will get this run now to ensure no UTI as a source. Diverticulosis could be a source, but she would need GI follow up for further investigation to evaluate for this. Also discussed that gastritis/ulcer could cause the epigastric discomfort, and stress could be contributing to this. Doubt need for acute imaging, doubt emergent  etiology assuming U/A returns unremarkable. Will reassess after U/A. Pt declines wanting anything at this time for pain or symptoms. Will reassess shortly  2:03 PM  Differential unremarkable, doubt that the leukocytosis is truly infectious in nature, likely hemoconcentrated. U/A unremarkable, no evidence of UTI. Story most consistent with IBS but would need further GI work up to r/o other causes before formally having this as a diagnosis. Discussed OTC meds to help with symptoms, and stress management things to try, and then f/up with GI in 1-2wks for ongoing management of her symptoms. I explained the diagnosis and have given explicit precautions to return to the ER including for any other new or worsening symptoms. The patient understands and accepts the medical plan as it's been dictated and I have answered their questions. Discharge instructions concerning home care and prescriptions have been given. The patient is STABLE and is discharged to home in good condition.  BP 143/92 mmHg  Pulse 85  Temp(Src) 98.1 F (36.7 C) (Oral)  Resp 15  SpO2 98%  LMP 06/10/2015 (Approximate)     Jeneane Pieczynski Camprubi-Soms, PA-C 07/16/15 1404  Leta BaptistEmily Roe Nguyen, MD 07/18/15 [phone]

## 2015-07-18 ENCOUNTER — Ambulatory Visit: Payer: BLUE CROSS/BLUE SHIELD | Admitting: Gastroenterology

## 2015-07-24 ENCOUNTER — Ambulatory Visit: Payer: BLUE CROSS/BLUE SHIELD | Admitting: Gastroenterology

## 2015-08-21 ENCOUNTER — Other Ambulatory Visit (HOSPITAL_COMMUNITY)
Admission: RE | Admit: 2015-08-21 | Discharge: 2015-08-21 | Disposition: A | Discharge status: Home / Self Care | Payer: PRIVATE HEALTH INSURANCE | Source: Ambulatory Visit | Attending: Internal Medicine | Admitting: Internal Medicine

## 2015-08-21 ENCOUNTER — Other Ambulatory Visit: Payer: Self-pay | Admitting: Family Medicine

## 2015-08-21 DIAGNOSIS — Z01411 Encounter for gynecological examination (general) (routine) with abnormal findings: Secondary | ICD-10-CM | POA: Diagnosis present

## 2015-08-22 ENCOUNTER — Encounter: Payer: Self-pay | Admitting: Gynecology

## 2015-08-23 LAB — CYTOLOGY - PAP

## 2015-08-29 ENCOUNTER — Encounter: Payer: Self-pay | Admitting: Gynecology

## 2017-03-14 ENCOUNTER — Emergency Department (HOSPITAL_COMMUNITY): Payer: PRIVATE HEALTH INSURANCE

## 2017-03-14 ENCOUNTER — Encounter (HOSPITAL_COMMUNITY): Payer: Self-pay | Admitting: Emergency Medicine

## 2017-03-14 ENCOUNTER — Emergency Department (HOSPITAL_COMMUNITY)
Admission: EM | Admit: 2017-03-14 | Discharge: 2017-03-14 | Disposition: A | Discharge status: Home / Self Care | Payer: PRIVATE HEALTH INSURANCE | Attending: Emergency Medicine | Admitting: Emergency Medicine

## 2017-03-14 DIAGNOSIS — Y999 Unspecified external cause status: Secondary | ICD-10-CM | POA: Diagnosis not present

## 2017-03-14 DIAGNOSIS — X500XXA Overexertion from strenuous movement or load, initial encounter: Secondary | ICD-10-CM | POA: Insufficient documentation

## 2017-03-14 DIAGNOSIS — Z87891 Personal history of nicotine dependence: Secondary | ICD-10-CM | POA: Diagnosis not present

## 2017-03-14 DIAGNOSIS — Y9301 Activity, walking, marching and hiking: Secondary | ICD-10-CM | POA: Insufficient documentation

## 2017-03-14 DIAGNOSIS — Z79899 Other long term (current) drug therapy: Secondary | ICD-10-CM | POA: Insufficient documentation

## 2017-03-14 DIAGNOSIS — S93432A Sprain of tibiofibular ligament of left ankle, initial encounter: Secondary | ICD-10-CM | POA: Diagnosis not present

## 2017-03-14 DIAGNOSIS — S99912A Unspecified injury of left ankle, initial encounter: Secondary | ICD-10-CM | POA: Diagnosis present

## 2017-03-14 DIAGNOSIS — Y9289 Other specified places as the place of occurrence of the external cause: Secondary | ICD-10-CM | POA: Insufficient documentation

## 2017-03-14 DIAGNOSIS — S93492A Sprain of other ligament of left ankle, initial encounter: Secondary | ICD-10-CM

## 2017-03-14 MED ORDER — CYCLOBENZAPRINE HCL 10 MG PO TABS: 10.0000 mg | ORAL_TABLET | Freq: Two times a day (BID) | ORAL | 0 refills | Status: DC | PRN

## 2017-03-14 MED ORDER — IBUPROFEN 600 MG PO TABS: 600.0000 mg | ORAL_TABLET | Freq: Four times a day (QID) | ORAL | 0 refills | Status: DC | PRN

## 2017-03-14 NOTE — ED Triage Notes (Signed)
Pt twisted ankle yesterday, c/o pain and swelling to L ankle. Pt states she tried Ice and acetaminophen last night, pain with walking this morning. L ankle edema present.

## 2017-03-14 NOTE — ED Notes (Signed)
Bed: WTR8 Expected date:  Expected time:  Means of arrival:  Comments: 

## 2017-03-14 NOTE — ED Provider Notes (Signed)
[facility]-EMERGENCY DEPT Provider Note   CSN: [id number] Arrival date & time: 03/14/17  0908     History   Chief Complaint Chief Complaint  Patient presents with  . Ankle Pain    L ankle    HPI Amanda Gamble is a 47 y.o. female.  HPI   47 year old female presenting c/o ankle pain. Patient reports yesterday afternoon she was walking and suddenly invert her left ankle inward. She didn't fall but complaining of significant pain and swelling to her left ankle. Pain is described as a sharp shooting sensation, 10 out of 10, worse with weightbearing. She has tried to wrap it and keep it elevated without relief. She denies any prior injury to same ankle. She does not complaining of any pain to her foot, or knee. No numbness.   Past Medical History:  Diagnosis Date  . Anxiety   . Depression     There are no active problems to display for this patient.   Past Surgical History:  Procedure Laterality Date  . CESAREAN SECTION    . TUBAL LIGATION      OB History    Gravida Para Term Preterm AB Living   2 2       1    SAB TAB Ectopic Multiple Live Births                   Home Medications    Prior to Admission medications   Medication Sig Start Date End Date Taking? Authorizing Provider  acetaminophen (TYLENOL) 500 MG tablet Take 1,000 mg by mouth 3 (three) times daily as needed for moderate pain.    [provider]  Aspirin-Acetaminophen-Caffeine (PAMPRIN MAX PO) Take 2 tablets by mouth daily as needed (pain.).    [provider]  BLACK COHOSH EXTRACT PO Take 2 tablets by mouth daily.    [provider]  Chlorpheniramine Maleate (ALLERGY PO) Take 1 tablet by mouth daily.    [provider]  ibuprofen (ADVIL,MOTRIN) 200 MG tablet Take 400 mg by mouth every 4 (four) hours as needed for moderate pain.    [provider]  Ibuprofen-Diphenhydramine Cit (ADVIL PM PO) Take 2 tablets by mouth at bedtime as needed  (pain.).    [provider]  medroxyPROGESTERone (PROVERA) 10 MG tablet Take 1 tablet (10 mg total) by mouth daily. Patient not taking: Reported on 07/16/2015 10/13/13   Fontaine, Nadyne Coombesimothy P, MD  Melatonin 5 MG CAPS Take 5 mg by mouth at bedtime as needed (sleep.).    [provider]  Multiple Vitamin (MULTIVITAMIN WITH MINERALS) TABS tablet Take 1 tablet by mouth daily.    [provider]    Family History Family History  Problem Relation Age of Onset  . Dementia Mother   . Ovarian cancer Mother   . Heart disease Father   . Breast cancer Paternal Aunt        6460's  . Heart failure Maternal Grandmother   . Breast cancer Paternal Aunt        2160's  . Breast cancer Paternal Aunt        360's    Social History Social History   Tobacco Use  . Smoking status: Former Games developermoker  . Smokeless tobacco: Never Used  Substance Use Topics  . Alcohol use: Yes    Comment: Occas  . Drug use: No     Allergies   Patient has no known allergies.   Review of Systems  Review of Systems  Constitutional: Negative for fever.  Musculoskeletal: Positive for arthralgias and joint swelling.  Neurological: Negative for numbness.     Physical Exam Updated Vital Signs BP (!) 127/91 (BP Location: Left Arm)   Pulse 60   Temp 98.2 F (36.8 C) (Oral)   Resp 18   LMP 06/10/2015 (Approximate)   SpO2 100%   Physical Exam  Constitutional: She appears well-developed and well-nourished. No distress.  HENT:  Head: Atraumatic.  Eyes: Conjunctivae are normal.  Neck: Neck supple.  Musculoskeletal: She exhibits tenderness (Left ankle: Tenderness and swelling noted to the lateral malleolus region and proximal dorsum of ankle without crepitus or deformity. Pain with foot inversion. No tenderness to fifth metatarsal region. Dorsalis pedis pulse palpable. Left knee nontender.).  Neurological: She is alert.  Skin: No rash noted.  Psychiatric: She has a normal mood and affect.  Nursing  note and vitals reviewed.    ED Treatments / Results  Labs (all labs ordered are listed, but only abnormal results are displayed) Labs Reviewed - No data to display  EKG  EKG Interpretation None       Radiology Dg Ankle Complete Left  Result Date: 03/14/2017 CLINICAL DATA:  Twisted ankle, pain/swelling EXAM: LEFT ANKLE COMPLETE - 3+ VIEW COMPARISON:  None. FINDINGS: No fracture or dislocation is seen. The ankle mortise is intact. The base of the fifth metatarsal is unremarkable. The visualized soft tissues are unremarkable. IMPRESSION: Negative. Electronically Signed   By: Charline Bills M.D.   On: 03/14/2017 10:29   Dg Foot Complete Left  Result Date: 03/14/2017 CLINICAL DATA:  Twisted ankle, pain/swelling EXAM: LEFT FOOT - COMPLETE 3+ VIEW COMPARISON:  None. FINDINGS: No fracture or dislocation is seen. The joint spaces are preserved. The visualized soft tissues are unremarkable. IMPRESSION: Negative. Electronically Signed   By: Charline Bills M.D.   On: 03/14/2017 10:28    Procedures Procedures (including critical care time)  Medications Ordered in ED Medications - No data to display   Initial Impression / Assessment and Plan / ED Course  I have reviewed the triage vital signs and the nursing notes.  Pertinent labs & imaging results that were available during my care of the patient were reviewed by me and considered in my medical decision making (see chart for details).     BP (!) 127/91 (BP Location: Left Arm)   Pulse 60   Temp 98.2 F (36.8 C) (Oral)   Resp 18   LMP 06/10/2015 (Approximate)   SpO2 100%    Final Clinical Impressions(s) / ED Diagnoses   Final diagnoses:  Sprain of anterior talofibular ligament of left ankle, initial encounter    ED Discharge Orders        Ordered    ibuprofen (IBU) 600 MG tablet  Every 6 hours PRN     03/14/17 1245    cyclobenzaprine (FLEXERIL) 10 MG tablet  2 times daily PRN     03/14/17 1245     12:46 PM Pt  sprained her L ankle, xray of L ankle/foot negative.  She is NVI.  No knee pain.  RICE therapy discussed.  ASO and Crutches provided for support.  Ortho referral as needed.   Fayrene Helper, PA-C 03/14/17 1247    Arby Barrette, MD 03/22/17 [phone]

## 2017-03-14 NOTE — ED Notes (Signed)
Orth tech at bedside doing teaching regarding brace and crutches.

## 2018-11-29 ENCOUNTER — Encounter: Payer: Self-pay | Admitting: Gynecology

## 2018-12-14 ENCOUNTER — Other Ambulatory Visit: Payer: Self-pay

## 2018-12-14 DIAGNOSIS — Z20822 Contact with and (suspected) exposure to covid-19: Secondary | ICD-10-CM

## 2018-12-15 LAB — NOVEL CORONAVIRUS, NAA: SARS-CoV-2, NAA: NOT DETECTED

## 2020-06-20 ENCOUNTER — Other Ambulatory Visit: Payer: Self-pay | Admitting: Family Medicine

## 2020-06-20 DIAGNOSIS — R258 Other abnormal involuntary movements: Secondary | ICD-10-CM

## 2020-06-20 DIAGNOSIS — R4789 Other speech disturbances: Secondary | ICD-10-CM

## 2020-06-24 ENCOUNTER — Other Ambulatory Visit (HOSPITAL_COMMUNITY): Payer: Self-pay | Admitting: Family Medicine

## 2020-06-24 DIAGNOSIS — R258 Other abnormal involuntary movements: Secondary | ICD-10-CM

## 2020-06-24 DIAGNOSIS — R4789 Other speech disturbances: Secondary | ICD-10-CM

## 2020-07-01 ENCOUNTER — Ambulatory Visit (HOSPITAL_COMMUNITY): Payer: 59

## 2020-07-02 ENCOUNTER — Encounter: Payer: Self-pay | Admitting: Neurology

## 2020-07-08 ENCOUNTER — Emergency Department (HOSPITAL_COMMUNITY): Payer: 59

## 2020-07-08 ENCOUNTER — Ambulatory Visit (HOSPITAL_COMMUNITY): Payer: 59

## 2020-07-08 ENCOUNTER — Other Ambulatory Visit: Payer: Self-pay

## 2020-07-08 ENCOUNTER — Emergency Department (HOSPITAL_COMMUNITY)
Admission: EM | Admit: 2020-07-08 | Discharge: 2020-07-09 | Disposition: A | Discharge status: Home / Self Care | Payer: 59 | Attending: Emergency Medicine | Admitting: Emergency Medicine

## 2020-07-08 ENCOUNTER — Encounter (HOSPITAL_COMMUNITY): Payer: Self-pay

## 2020-07-08 DIAGNOSIS — M6281 Muscle weakness (generalized): Secondary | ICD-10-CM | POA: Insufficient documentation

## 2020-07-08 DIAGNOSIS — R531 Weakness: Secondary | ICD-10-CM

## 2020-07-08 DIAGNOSIS — R202 Paresthesia of skin: Secondary | ICD-10-CM | POA: Insufficient documentation

## 2020-07-08 DIAGNOSIS — R200 Anesthesia of skin: Secondary | ICD-10-CM

## 2020-07-08 DIAGNOSIS — M62838 Other muscle spasm: Secondary | ICD-10-CM | POA: Diagnosis not present

## 2020-07-08 DIAGNOSIS — Z87891 Personal history of nicotine dependence: Secondary | ICD-10-CM | POA: Diagnosis not present

## 2020-07-08 LAB — COMPREHENSIVE METABOLIC PANEL
ALT: 25 U/L (ref 0–44)
AST: 24 U/L (ref 15–41)
Albumin: 3.9 g/dL (ref 3.5–5.0)
Alkaline Phosphatase: 67 U/L (ref 38–126)
Anion gap: 10 (ref 5–15)
BUN: 9 mg/dL (ref 6–20)
CO2: 23 mmol/L (ref 22–32)
Calcium: 9.5 mg/dL (ref 8.9–10.3)
Chloride: 102 mmol/L (ref 98–111)
Creatinine, Ser: 0.61 mg/dL (ref 0.44–1.00)
GFR, Estimated: >60 mL/min (ref >60)
Glucose, Bld: 132 mg/dL — ABNORMAL HIGH (ref 70–99)
Potassium: 3.8 mmol/L (ref 3.5–5.1)
Sodium: 135 mmol/L (ref 135–145)
Total Bilirubin: 0.4 mg/dL (ref 0.3–1.2)
Total Protein: 7.1 g/dL (ref 6.5–8.1)

## 2020-07-08 LAB — CBC WITH DIFFERENTIAL/PLATELET
Abs Immature Granulocytes: 0.04 10*3/uL (ref 0.00–0.07)
Basophils Absolute: 0.1 10*3/uL (ref 0.0–0.1)
Basophils Relative: 1 %
Eosinophils Absolute: 0.4 10*3/uL (ref 0.0–0.5)
Eosinophils Relative: 3 %
HCT: 44.6 % (ref 36.0–46.0)
Hemoglobin: 14.9 g/dL (ref 12.0–15.0)
Immature Granulocytes: 0 %
Lymphocytes Relative: 35 %
Lymphs Abs: 4.2 10*3/uL — ABNORMAL HIGH (ref 0.7–4.0)
MCH: 29.7 pg (ref 26.0–34.0)
MCHC: 33.4 g/dL (ref 30.0–36.0)
MCV: 88.8 fL (ref 80.0–100.0)
Monocytes Absolute: 0.7 10*3/uL (ref 0.1–1.0)
Monocytes Relative: 6 %
Neutro Abs: 6.6 10*3/uL (ref 1.7–7.7)
Neutrophils Relative %: 55 %
Platelets: 371 10*3/uL (ref 150–400)
RBC: 5.02 MIL/uL (ref 3.87–5.11)
RDW: 12.9 % (ref 11.5–15.5)
WBC: 12 10*3/uL — ABNORMAL HIGH (ref 4.0–10.5)
nRBC: 0 % (ref 0.0–0.2)

## 2020-07-08 MED ORDER — GADOBUTROL 1 MMOL/ML IV SOLN
7.5000 mL | Freq: Once | INTRAVENOUS | Status: AC | PRN
  Administered 2020-07-08: 7.5 mL via INTRAVENOUS

## 2020-07-08 NOTE — Discharge Instructions (Addendum)
You have been seen and discharged from the emergency department.  Follow-up with your neurology appointment and primary provider for reevaluation and further care. Take home medications as prescribed. If you have any worsening symptoms or further concerns for your health please return to an emergency department for further evaluation.

## 2020-07-08 NOTE — ED Provider Notes (Signed)
Emergency Medicine Provider Triage Evaluation Note  Amanda Gamble 50 y.o. female was evaluated in triage.  Pt complains of fatigue, spasms in her legs, generalized weakness that has been ongoing since November when she stopped Xanax.  She saw her PCP who evaluated her.  She was referred to a neurologist but she has not seen a neurologist again.  Patient states that she feels like it is getting worse.  She is not sleeping well.  Denies any chest pain, abdominal pain, fevers.  Review of Systems  Positive: Leg spasms, fatigue Negative: Pain, fever  Physical Exam  BP 134/82   Pulse 70   Temp 98.2 F (36.8 C) (Oral)   Resp 18   Ht 5\' 4"  (1.626 m)   Wt 65.8 kg   SpO2 100%   BMI 24.89 kg/m  Gen:   Awake, no distress   HEENT:  Atraumatic  Resp:  Normal effort  Cardiac:  Normal rate  Abd:   Nondistended, nontender  MSK:   Moves extremities without difficulty  Neuro:  Speech clear. 5/5 strength BUE and BLE.   Medical Decision Making  Medically screening exam initiated at 3:55 AM.  Appropriate orders placed.  Amanda Gamble was informed that the remainder of the evaluation will be completed by another provider, this initial triage assessment does not replace that evaluation, and the importance of remaining in the ED until their evaluation is complete.  Clinical Impression  Leg spasms, generalized weakness.    Portions of this note were generated with Loman Chroman. Dictation errors may occur despite best attempts at proofreading.     Scientist, clinical (histocompatibility and immunogenetics), PA-C 07/08/20 1525    09/07/20, MD 07/09/20 [phone]

## 2020-07-08 NOTE — ED Triage Notes (Signed)
Patient presents to the ED for evaluation of her legs stating that her legs feel heavy, reports of being dizzy, and also states that she is only getting 4 hours of sleep.

## 2020-07-08 NOTE — ED Notes (Signed)
Pt back from MRI 

## 2020-07-08 NOTE — ED Provider Notes (Signed)
[facility] EMERGENCY DEPARTMENT Provider Note   CSN: [id number] Arrival date & time: 07/08/20  1339     History Chief Complaint  Patient presents with  . Leg Pain    Amanda Gamble is a 50 y.o. female.  HPI   50 year old female presents the emergency department multiple complaints.  Patient states over the past couple months she has been having intermittent symptoms that have recently become severe.  She states that she has weakness in her bilateral lower extremities, they sometimes feel heavy like she cannot lift her legs.  She states earlier while she was at the store she was walking and her legs felt like "noodles" and she almost collapsed.  Patient has also been having intermittent weakness in her bilateral hands with scattered numbness.  She states her symptoms can sometimes be in 1 extremity or all 4 extremities at once.  She is also been having lightheadedness and feeling extremely fatigued.  She states she is following with her primary doctor who has concern for stroke/MS.  She has a follow-up with neurologist in a couple weeks, has been unable to get any MRI done as an outpatient due to insurance issues.  Past Medical History:  Diagnosis Date  . Anxiety   . Depression     There are no problems to display for this patient.   Past Surgical History:  Procedure Laterality Date  . CESAREAN SECTION    . TUBAL LIGATION       OB History    Gravida  2   Para  2   Term      Preterm      AB      Living  1     SAB      IAB      Ectopic      Multiple      Live Births              Family History  Problem Relation Age of Onset  . Dementia Mother   . Ovarian cancer Mother   . Heart disease Father   . Breast cancer Paternal Aunt        66's  . Heart failure Maternal Grandmother   . Breast cancer Paternal Aunt        60's  . Breast cancer Paternal Aunt        21's    Social History   Tobacco Use  . Smoking status: Former  Games developer  . Smokeless tobacco: Never Used  Vaping Use  . Vaping Use: Never used  Substance Use Topics  . Alcohol use: Yes    Comment: Occas  . Drug use: No    Home Medications Prior to Admission medications   Medication Sig Start Date End Date Taking? Authorizing Provider  acetaminophen (TYLENOL) 500 MG tablet Take 1,000 mg by mouth 3 (three) times daily as needed for moderate pain.    [provider]  Aspirin-Acetaminophen-Caffeine (PAMPRIN MAX PO) Take 2 tablets by mouth daily as needed (pain.).    [provider]  BLACK COHOSH EXTRACT PO Take 2 tablets by mouth daily.    [provider]  Chlorpheniramine Maleate (ALLERGY PO) Take 1 tablet by mouth daily.    [provider]  cyclobenzaprine (FLEXERIL) 10 MG tablet Take 1 tablet (10 mg total) by mouth 2 (two) times daily as needed for muscle spasms. 03/14/17   Fayrene Helper, PA-C  ibuprofen (IBU) 600 MG tablet Take 1 tablet (600 mg  total) by mouth every 6 (six) hours as needed for moderate pain. 03/14/17   Fayrene Helper, PA-C  Ibuprofen-Diphenhydramine Cit (ADVIL PM PO) Take 2 tablets by mouth at bedtime as needed (pain.).    [provider]  medroxyPROGESTERone (PROVERA) 10 MG tablet Take 1 tablet (10 mg total) by mouth daily. Patient not taking: Reported on 07/16/2015 10/13/13   Fontaine, Nadyne Coombes, MD  Melatonin 5 MG CAPS Take 5 mg by mouth at bedtime as needed (sleep.).    [provider]  Multiple Vitamin (MULTIVITAMIN WITH MINERALS) TABS tablet Take 1 tablet by mouth daily.    [provider]    Allergies    Patient has no known allergies.  Review of Systems   Review of Systems  Constitutional: Negative for chills and fever.  HENT: Negative for congestion.   Eyes: Negative for visual disturbance.  Respiratory: Negative for shortness of breath.   Cardiovascular: Negative for chest pain.  Gastrointestinal: Negative for abdominal pain, diarrhea and vomiting.  Genitourinary:  Negative for dysuria.  Skin: Negative for rash.  Neurological: Positive for weakness, light-headedness and numbness. Negative for facial asymmetry, speech difficulty and headaches.  Psychiatric/Behavioral: Positive for sleep disturbance. Negative for hallucinations. The patient is nervous/anxious.     Physical Exam Updated Vital Signs BP (!) 126/95 (BP Location: Right Arm)   Pulse 95   Temp 98.4 F (36.9 C) (Oral)   Resp 17   Ht 5\' 1"  (1.549 m)   Wt 77.1 kg   LMP 06/10/2015 (Approximate)   SpO2 96%   BMI 32.12 kg/m   Physical Exam Vitals and nursing note reviewed.  Constitutional:      Appearance: Normal appearance.  HENT:     Head: Normocephalic.     Mouth/Throat:     Mouth: Mucous membranes are moist.  Eyes:     Pupils: Pupils are equal, round, and reactive to light.  Cardiovascular:     Rate and Rhythm: Normal rate.  Pulmonary:     Effort: Pulmonary effort is normal. No respiratory distress.  Abdominal:     Palpations: Abdomen is soft.     Tenderness: There is no abdominal tenderness.  Skin:    General: Skin is warm.  Neurological:     Mental Status: She is alert and oriented to person, place, and time. Mental status is at baseline.     Cranial Nerves: No cranial nerve deficit.     Comments: No noted weakness or drift on exam, sensory appears equal and intact, symmetric face, no acute neurologic complaint at this time.  Psychiatric:        Mood and Affect: Mood normal.     ED Results / Procedures / Treatments   Labs (all labs ordered are listed, but only abnormal results are displayed) Labs Reviewed  COMPREHENSIVE METABOLIC PANEL - Abnormal; Notable for the following components:      Result Value   Glucose, Bld 132 (*)    All other components within normal limits  CBC WITH DIFFERENTIAL/PLATELET - Abnormal; Notable for the following components:   WBC 12.0 (*)    Lymphs Abs 4.2 (*)    All other components within normal limits    EKG EKG  Interpretation  Date/Time:  Monday Jul 08 2020 15:33:42 EDT Ventricular Rate:  95 PR Interval:  138 QRS Duration: 86 QT Interval:  356 QTC Calculation: 447 R Axis:   10 Text Interpretation: Normal sinus rhythm Cannot rule out Anterior infarct , age undetermined Abnormal ECG NSR Confirmed  by Coralee Pesa [phone]) on 07/08/2020 8:25:30 PM   Radiology No results found.  Procedures Procedures   Medications Ordered in ED Medications - No data to display  ED Course  I have reviewed the triage vital signs and the nursing notes.  Pertinent labs & imaging results that were available during my care of the patient were reviewed by me and considered in my medical decision making (see chart for details).    MDM Rules/Calculators/A&P                          50 year old female presents the emergency department with concern for extremity numbness/weakness, heaviness and spasms.  She states that she is being evaluated as an outpatient for concern of stroke/MS.  Unable to get an MRI as an outpatient.  Vitals are stable here, she appears neurologically intact but her symptoms are concerning.  MRI of the brain and cervical spine were done which shows no signs of mass, stroke or MS.  Blood work is reassuring, mild leukocytosis but otherwise without any acute abnormalities.  Patient already has follow-up with neurology, she will also follow-up with her primary doctor.  Patient will be discharged and treated as an outpatient.  Discharge plan and strict return to ED precautions discussed, patient verbalizes understanding and agreement.  Final Clinical Impression(s) / ED Diagnoses Final diagnoses:  None    Rx / DC Orders ED Discharge Orders    None       Rozelle Logan, DO 07/09/20 0001

## 2020-07-09 ENCOUNTER — Telehealth: Payer: Self-pay | Admitting: Neurology

## 2020-07-09 NOTE — Telephone Encounter (Signed)
Pt called, need sooner appt have scheduled an appt 08/13/20, Would like a call from the nurse.

## 2020-07-09 NOTE — ED Notes (Signed)
Pt d/c by  MD and is provided w/ d.c instructions and follow up care, pt out of ED ambulatory  

## 2020-07-23 ENCOUNTER — Institutional Professional Consult (permissible substitution): Payer: 59 | Admitting: Neurology

## 2020-08-13 ENCOUNTER — Ambulatory Visit: Payer: 59 | Admitting: Neurology

## 2020-08-13 ENCOUNTER — Encounter: Payer: Self-pay | Admitting: Neurology

## 2020-08-13 VITALS — BP 127/74 | HR 90 | Ht 61.0 in | Wt 171.0 lb

## 2020-08-13 DIAGNOSIS — G4761 Periodic limb movement disorder: Secondary | ICD-10-CM

## 2020-08-13 DIAGNOSIS — E669 Obesity, unspecified: Secondary | ICD-10-CM | POA: Diagnosis not present

## 2020-08-13 DIAGNOSIS — R351 Nocturia: Secondary | ICD-10-CM

## 2020-08-13 DIAGNOSIS — G4719 Other hypersomnia: Secondary | ICD-10-CM

## 2020-08-13 DIAGNOSIS — R0683 Snoring: Secondary | ICD-10-CM | POA: Diagnosis not present

## 2020-08-13 DIAGNOSIS — Z820 Family history of epilepsy and other diseases of the nervous system: Secondary | ICD-10-CM

## 2020-08-13 DIAGNOSIS — R0681 Apnea, not elsewhere classified: Secondary | ICD-10-CM

## 2020-08-13 DIAGNOSIS — G2581 Restless legs syndrome: Secondary | ICD-10-CM

## 2020-08-13 DIAGNOSIS — E66811 Obesity, class 1: Secondary | ICD-10-CM

## 2020-08-13 DIAGNOSIS — R519 Headache, unspecified: Secondary | ICD-10-CM

## 2020-08-13 NOTE — Patient Instructions (Signed)

## 2020-08-13 NOTE — Progress Notes (Signed)
Subjective:    Patient ID: Amanda Gamble is a 50 y.o. female.  HPI     Huston Foley, MD, PhD [facility] [address], [address] [address] [place], [place] [address]  Dear Dr. Thomasena Edis,   I saw your patient, Amanda Gamble, upon your kind request in my sleep clinic today for initial consultation of her sleep disorder, in particular, concern for underlying obstructive sleep apnea.  The patient is unaccompanied today.  As you know, Ms. Spieth is a 50 year old right-handed woman with an underlying medical history of anxiety, depression, hyperlipidemia, diabetes, and obesity, who reports snoring and excessive daytime somnolence as well as witnessed apneas per husband's report.  I reviewed your office note from 01/24/2020.  Her Epworth sleepiness score is 10 out of 24, fatigue severity score is 50 out of 63.  Her mother has sleep apnea.  Patient reports history of restless leg symptoms and recently coming off of Xanax in November 2021, she reports that she had withdrawal symptoms including leg twitching and flareup of her restless leg symptoms.  She reports a family history of restless leg syndrome.  Her husband has complained about her leg movements.  She tried another medication after coming off the Xanax, she is currently on sertraline 100 mg twice daily.  She has had abnormal involuntary movements in her feet and also reports occasional tingling in her hands, she does have a history of carpal tunnel when she was pregnant.  She has started taking magnesium and also takes melatonin at night for sleep, since starting the magnesium her leg and foot cramping has improved.  She generally goes to bed around 11 PM.  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  She lives with her husband who works nights.  They have a 8 year old daughter.  Her older daughter passed away several years ago, she reports that  her older daughter had special needs.  Patient is a non-smoker and does not drink alcohol, she drinks caffeine in the form of diet soda, about 3-4 servings per day on average.  Rise time is around 7.  She has nocturia about twice per average night and has had occasional morning headaches, feels like a pressure sensation or sinus headache.   She recently presented to the emergency room for paresthesias.  She had a recent brain MRI with and without contrast on 07/08/2020 as well as a cervical spine MRI with and without contrast on 07/08/2020 and I reviewed the results:   IMPRESSION: Normal brain MRI.   IMPRESSION: 1. No evidence of demyelinating disease of the cervical spine. 2. 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  Her Past Medical History Is Significant For: Past Medical History:  Diagnosis Date  . Anxiety   . Depression     Her Past Surgical History Is Significant For: Past Surgical History:  Procedure Laterality Date  . CESAREAN SECTION    . TUBAL LIGATION      Her Family History Is Significant For: Family History  Problem Relation Age of Onset  . Dementia Mother   . Ovarian cancer Mother   . Heart disease Father   . Breast cancer Paternal Aunt        89's  . Heart failure Maternal Grandmother   . Breast cancer Paternal Aunt        39's  . Breast cancer Paternal Aunt        68's  Her Social History Is Significant For: Social History   Socioeconomic History  . Marital status: Married    Spouse name: Not on file  . Number of children: Not on file  . Years of education: Not on file  . Highest education level: Not on file  Occupational History  . Not on file  Tobacco Use  . Smoking status: Former Games developermoker  . Smokeless tobacco: Never Used  Vaping Use  . Vaping Use: Never used  Substance and Sexual Activity  . Alcohol use: Yes    Comment: Occas  . Drug use: No  . Sexual activity: Yes    Birth control/protection: Surgical    Comment: BTL  Other Topics  Concern  . Not on file  Social History Narrative  . Not on file   Social Determinants of Health   Financial Resource Strain: Not on file  Food Insecurity: Not on file  Transportation Needs: Not on file  Physical Activity: Not on file  Stress: Not on file  Social Connections: Not on file    Her Allergies Are:  No Known Allergies:   Her Current Medications Are:  Outpatient Encounter Medications as of 08/13/2020  Medication Sig  . acetaminophen (TYLENOL) 500 MG tablet Take 1,000 mg by mouth 3 (three) times daily as needed for moderate pain.  . APPLE CIDER VINEGAR PO Take by mouth.  . Aspirin-Acetaminophen-Caffeine (PAMPRIN MAX PO) Take 2 tablets by mouth daily as needed (pain.).  Marland Kitchen. Chlorpheniramine Maleate (ALLERGY PO) Take 1 tablet by mouth daily.  . Ferrous Sulfate (IRON PO) Take by mouth.  Marland Kitchen. ibuprofen (IBU) 600 MG tablet Take 1 tablet (600 mg total) by mouth every 6 (six) hours as needed for moderate pain.  Marland Kitchen. MAGNESIUM PO Take by mouth.  . Melatonin 5 MG CAPS Take 5 mg by mouth at bedtime as needed (sleep.).  Marland Kitchen. metFORMIN (GLUCOPHAGE-XR) 500 MG 24 hr tablet 2 tablets with evening meal  . Multiple Vitamin (MULTIVITAMIN WITH MINERALS) TABS tablet Take 1 tablet by mouth daily.  . Probiotic Product (PROBIOTIC-10 PO) Take by mouth.  . sertraline (ZOLOFT) 100 MG tablet Take 1 tablet by mouth 2 (two) times daily.  . [DISCONTINUED] BLACK COHOSH EXTRACT PO Take 2 tablets by mouth daily.  . [DISCONTINUED] cyclobenzaprine (FLEXERIL) 10 MG tablet Take 1 tablet (10 mg total) by mouth 2 (two) times daily as needed for muscle spasms.  . [DISCONTINUED] Ibuprofen-Diphenhydramine Cit (ADVIL PM PO) Take 2 tablets by mouth at bedtime as needed (pain.).  . [DISCONTINUED] medroxyPROGESTERone (PROVERA) 10 MG tablet Take 1 tablet (10 mg total) by mouth daily. (Patient not taking: Reported on 07/16/2015)   No facility-administered encounter medications on file as of 08/13/2020.  :   Review of Systems:   Out of a complete 14 point review of systems, all are reviewed and negative with the exception of these symptoms as listed below:  Review of Systems  Neurological:       Here for sleep consult no prior sleep study. Snoring is present.   Epworth Sleepiness Scale 0= would never doze 1= slight chance of dozing 2= moderate chance of dozing 3= high chance of dozing  Sitting and reading:2 Watching TV:2 Sitting inactive in a public place (ex. Theater or meeting):1 As a passenger in a car for an hour without a break:2 Lying down to rest in the afternoon:2 Sitting and talking to someone:0 Sitting quietly after lunch (no alcohol):1 In a car, while stopped in traffic:0 Total:10     Objective:  Neurological Exam  Physical Exam Physical Examination:   There were no vitals filed for this visit.  General Examination: The patient is a very pleasant 50 y.o. female in no acute distress. She appears well-developed and well-nourished and well groomed.   HEENT: Normocephalic, atraumatic, pupils are equal, round and reactive to light, extraocular tracking is good without limitation to gaze excursion or nystagmus noted. Hearing is grossly intact. Face is symmetric with normal facial animation. Speech is clear with no dysarthria noted. There is no hypophonia. There is no lip, neck/head, jaw or voice tremor. Neck is supple with full range of passive and active motion. There are no carotid bruits on auscultation. Oropharynx exam reveals: mild mouth dryness, adequate dental hygiene and moderate airway crowding, due to small airway entry, Mallampati class IV, wider tongue noted.  Tonsils and uvula not fully visualized.  Tongue protrudes centrally and palate elevates symmetrically but uvula not fully visualized.  Neck circumference of 16 inches.  She has a mild overbite.  Chest: Clear to auscultation without wheezing, rhonchi or crackles noted.  Heart: S1+S2+0, regular and normal without murmurs, rubs or  gallops noted.   Abdomen: Soft, non-tender and non-distended with normal bowel sounds appreciated on auscultation.  Extremities: There is no pitting edema in the distal lower extremities bilaterally.   Skin: Warm and dry without trophic changes noted.   Musculoskeletal: exam reveals no obvious joint deformities, tenderness or joint swelling or erythema.   Neurologically:  Mental status: The patient is awake, alert and oriented in all 4 spheres. Her immediate and remote memory, attention, language skills and fund of knowledge are appropriate. There is no evidence of aphasia, agnosia, apraxia or anomia. Speech is clear with normal prosody and enunciation. Thought process is linear. Mood is normal and affect is normal.  Cranial nerves II - XII are as described above under HEENT exam.  Motor exam: Normal bulk, strength and tone is noted. There is no tremor, Romberg is negative. Fine motor skills and coordination: grossly intact.  Cerebellar testing: No dysmetria or intention tremor. There is no truncal or gait ataxia.  Sensory exam: intact to light touch in the upper and lower extremities.  Gait, station and balance: She stands easily. No veering to one side is noted. No leaning to one side is noted. Posture is age-appropriate and stance is narrow based. Gait shows normal stride length and normal pace. No problems turning are noted. Tandem walk is unremarkable.                Assessment and Plan:   In summary, NEVADA KIRCHNER is a very pleasant 50 y.o.-year old female with an underlying medical history of anxiety, depression, hyperlipidemia, diabetes, and obesity, whose history and physical exam are concerning for obstructive sleep apnea (OSA). I had a long chat with the patient about my findings and the diagnosis of OSA, its prognosis and treatment options. We talked about medical treatments, surgical interventions and non-pharmacological approaches. I explained in particular the risks and  ramifications of untreated moderate to severe OSA, especially with respect to developing cardiovascular disease down the Road, including congestive heart failure, difficult to treat hypertension, cardiac arrhythmias, or stroke. Even type 2 diabetes has, in part, been linked to untreated OSA. Symptoms of untreated OSA include daytime sleepiness, memory problems, mood irritability and mood disorder such as depression and anxiety, lack of energy, as well as recurrent headaches, especially morning headaches. We talked about trying to maintain a healthy lifestyle in general, as well as  the importance of weight control. We also talked about the importance of good sleep hygiene. I recommended the following at this time: sleep study.   I explained the sleep test procedure to the patient and also outlined possible surgical and non-surgical treatment options of OSA, including the use of a custom-made dental device (which would require a referral to a specialist dentist or oral surgeon), upper airway surgical options, such as traditional UPPP or a novel less invasive surgical option in the form of Inspire hypoglossal nerve stimulation (which would involve a referral to an ENT surgeon). I also explained the CPAP treatment option to the patient, who indicated that she would be willing to try CPAP if the need arises. I explained the importance of being compliant with PAP treatment, not only for insurance purposes but primarily to improve Her symptoms, and for the patient's long term health benefit, including to reduce Her cardiovascular risks. I answered all her questions today and the patient was in agreement. I plan to see herh back after the sleep study is completed and encouraged her to call with any interim questions, concerns, problems or updates.   Thank you very much for allowing me to participate in the care of this nice patient. If I can be of any further assistance to you please do not hesitate to call me at  [phone].  Sincerely,   Huston Foley, MD, PhD

## 2020-08-26 ENCOUNTER — Telehealth: Payer: Self-pay | Admitting: Neurology

## 2020-08-26 NOTE — Telephone Encounter (Signed)
Pt called, checking on the status of insurance paying for my sleep study. If you have not heard anything can you resend. Would like a call from the nurse.

## 2020-09-02 ENCOUNTER — Telehealth: Payer: Self-pay

## 2020-09-02 NOTE — Telephone Encounter (Signed)
LVM for pt to call me back to schedule sleep study  

## 2020-10-08 ENCOUNTER — Other Ambulatory Visit: Payer: Self-pay | Admitting: Family Medicine

## 2020-10-08 DIAGNOSIS — Z1231 Encounter for screening mammogram for malignant neoplasm of breast: Secondary | ICD-10-CM

## 2020-10-17 ENCOUNTER — Ambulatory Visit: Payer: 59

## 2020-10-28 ENCOUNTER — Ambulatory Visit (INDEPENDENT_AMBULATORY_CARE_PROVIDER_SITE_OTHER): Payer: 59 | Admitting: Neurology

## 2020-10-28 DIAGNOSIS — R519 Headache, unspecified: Secondary | ICD-10-CM

## 2020-10-28 DIAGNOSIS — Z820 Family history of epilepsy and other diseases of the nervous system: Secondary | ICD-10-CM

## 2020-10-28 DIAGNOSIS — R0681 Apnea, not elsewhere classified: Secondary | ICD-10-CM

## 2020-10-28 DIAGNOSIS — R351 Nocturia: Secondary | ICD-10-CM

## 2020-10-28 DIAGNOSIS — R0683 Snoring: Secondary | ICD-10-CM

## 2020-10-28 DIAGNOSIS — G2581 Restless legs syndrome: Secondary | ICD-10-CM

## 2020-10-28 DIAGNOSIS — G4719 Other hypersomnia: Secondary | ICD-10-CM

## 2020-10-28 DIAGNOSIS — G4761 Periodic limb movement disorder: Secondary | ICD-10-CM

## 2020-10-28 DIAGNOSIS — G4733 Obstructive sleep apnea (adult) (pediatric): Secondary | ICD-10-CM | POA: Diagnosis not present

## 2020-10-28 DIAGNOSIS — E669 Obesity, unspecified: Secondary | ICD-10-CM

## 2020-11-01 NOTE — Progress Notes (Signed)
See procedure note.

## 2020-11-04 NOTE — Procedures (Signed)
     [facility]  HOME SLEEP TEST (Watch PAT) REPORT  STUDY DATE: 10/28/2020  DOB: January 05, 1971  MRN: [id number]  ORDERING CLINICIAN: Huston Foley, MD, PhD   REFERRING CLINICIAN: Dr. Irena Reichmann  CLINICAL INFORMATION/HISTORY: 50 year old right-handed woman with an underlying medical history of anxiety, depression, hyperlipidemia, diabetes, and obesity, who reports snoring and excessive daytime somnolence as well as witnessed apneas per husband's report.   Epworth sleepiness score: 10/24.  BMI: 32.5 kg/m  FINDINGS:   Sleep Summary:   Total Recording Time (hours, min): 8 hours, 21 minutes  Total Sleep Time (hours, min):  6 hours, 22 minutes   Percent REM (%):    20.5   Respiratory Indices:   Calculated pAHI (per hour):  24.3/hour         REM pAHI:    37.9/hour       NREM pAHI: 20.7/hour  Oxygen Saturation Statistics:    Oxygen Saturation (%) Mean: 94%   Minimum oxygen saturation (%):                 90%   O2 Saturation Range (%): 90-98%    O2 Saturation (minutes) <=88%: 0 min  Pulse Rate Statistics:   Pulse Mean (bpm):    76/min    Pulse Range (51-111/min)   IMPRESSION: OSA (obstructive sleep apnea)   RECOMMENDATION:  This home sleep test demonstrates moderate obstructive sleep apnea -by number of events - with a total AHI of 24.3/hour and O2 nadir of 90%.  Moderate to loud snoring was detected.  Treatment with positive airway pressure is recommended. The patient will be advised to proceed with an autoPAP titration/trial at home for now. A full night titration study may be considered to optimize treatment settings, if needed down the road.  Alternative treatment options may include a dental device or surgical options.  Concomitant weight loss is recommended.  Please note that untreated obstructive sleep apnea may carry additional perioperative morbidity. Patients with significant obstructive sleep apnea should receive perioperative PAP therapy  and the surgeons and particularly the anesthesiologist should be informed of the diagnosis and the severity of the sleep disordered breathing. The patient should be cautioned not to drive, work at heights, or operate dangerous or heavy equipment when tired or sleepy. Review and reiteration of good sleep hygiene measures should be pursued with any patient. Other causes of the patient's symptoms, including circadian rhythm disturbances, an underlying mood disorder, medication effect and/or an underlying medical problem cannot be ruled out based on this test. Clinical correlation is recommended. The patient and her referring provider will be notified of the test results. The patient will be seen in follow up in sleep clinic at [facility].  I certify that I have reviewed the raw data recording prior to the issuance of this report in accordance with the standards of the American Academy of Sleep Medicine (AASM).  INTERPRETING PHYSICIAN:   Huston Foley, MD, PhD  Board Certified in Neurology and Sleep Medicine  [facility] [address] [place], [place] [address] [phone]

## 2020-11-04 NOTE — Addendum Note (Signed)
Addended by: Huston Foley on: 11/04/2020 02:22 PM   Modules accepted: Orders

## 2020-11-06 ENCOUNTER — Telehealth: Payer: Self-pay | Admitting: *Deleted

## 2020-11-06 ENCOUNTER — Encounter: Payer: Self-pay | Admitting: *Deleted

## 2020-11-06 NOTE — Telephone Encounter (Signed)
-----   Message from Huston Foley, MD sent at 11/04/2020  2:22 PM EDT ----- Patient referred by Dr. Thomasena Edis, seen by me on 08/13/2020, patient had a HST on 10/28/2020.    Please call and notify the patient that the recent home sleep test showed obstructive sleep apnea in the moderate range -by number of events - with a total AHI of 24.3/hour and O2 nadir of 90%.  Moderate to loud snoring was detected.  I recommend that she start treatment at home in the form of AutoPap therapy.  This means, that we don't have to bring her in for a sleep study with CPAP, but will let her start using a so called autoPAP machine at home, which is a CPAP-like machine with self-adjusting pressures. We will send the order to a local DME company (of her choice, or as per insurance requirement). The DME representative will fit her with a mask, educate her on how to use the machine, how to put the mask on, etc. I have placed an order in the chart. Please send the order, talk to patient, send report to referring MD. We will need a FU in sleep clinic for 10 weeks post-PAP set up, please arrange that with me or one of our NPs. Also reinforce the need for compliance with treatment. Thanks,   Huston Foley, MD, PhD [facility])

## 2020-11-06 NOTE — Telephone Encounter (Signed)
I spoke with patient and we discussed the home sleep test results as noted below by Dr. Frances Furbish.  The patient understands the findings and the recommendations that Dr. Frances Furbish has made for treatment of patient's OSA via autoPAP.  The patient is concerned about her insurance not paying much towards it but she is willing to go ahead and let us send the order over to [facility] so that she can discuss this with them after they perform a benefits review.  Patient understands [facility] will be her main contact for the device and they will personally show her how to use the machine, fit her with a mask, and show her how to put it on. The patient is aware of compliance requirements of at least 4 hours of use per night and also that we need to see her in the sleep clinic 31 to 89 days after she starts the machine.  She is going to proceed with finding out more information before she schedules a follow-up appointment with our office.  Patient confirmed her address I can send her a letter summarizing our call.  She was very Adult nurse.  Her questions were answered.  Results routed to referring provider. Letter sent to patient. Order sent to [employer].

## 2020-11-13 NOTE — Telephone Encounter (Signed)
Perfect. Pt's cpap order and supporting information faxed over to [employer] at [phone]. Received a receipt of confirmation.

## 2020-11-13 NOTE — Telephone Encounter (Signed)
Pt called says she spoke with her insurance company and they told her the DME they are in network with is [facility]

## 2020-11-13 NOTE — Telephone Encounter (Signed)
Received a secure message from [facility] w/ [employer]. Unfortunately pt's plan called "Friday" is not in-network with [employer]. Patient will need to reach out to her insurance to see which DME companies are in-network.   I spoke with the patient and updated her. She will call her insurance company and find out which CPAP DME companies are in-network and she will get back to [place]. She verbalized appreciation for the call.

## 2021-05-01 NOTE — Telephone Encounter (Signed)
Faxed request for update to [facility]. Received a receipt of confirmation.

## 2021-05-05 NOTE — Telephone Encounter (Signed)
Spoke with Amanda Gamble and was advised the pt was contacted by them on 11/21/20 and once benefit information was discussed, the pt asked them to cancel the order. They are happy to help the patient if she changes her mind. The order is still good.

## 2021-05-19 ENCOUNTER — Encounter: Payer: Self-pay | Admitting: *Deleted

## 2021-09-24 ENCOUNTER — Ambulatory Visit (HOSPITAL_BASED_OUTPATIENT_CLINIC_OR_DEPARTMENT_OTHER)
Admission: RE | Admit: 2021-09-24 | Discharge: 2021-09-24 | Disposition: A | Discharge status: Home / Self Care | Payer: 59 | Source: Ambulatory Visit | Attending: Family Medicine | Admitting: Family Medicine

## 2021-09-24 ENCOUNTER — Encounter (HOSPITAL_BASED_OUTPATIENT_CLINIC_OR_DEPARTMENT_OTHER): Payer: Self-pay

## 2021-09-24 DIAGNOSIS — Z1231 Encounter for screening mammogram for malignant neoplasm of breast: Secondary | ICD-10-CM | POA: Insufficient documentation

## 2022-11-02 ENCOUNTER — Other Ambulatory Visit: Payer: Self-pay

## 2022-11-02 ENCOUNTER — Other Ambulatory Visit (HOSPITAL_BASED_OUTPATIENT_CLINIC_OR_DEPARTMENT_OTHER): Payer: Self-pay

## 2022-11-02 ENCOUNTER — Emergency Department (HOSPITAL_BASED_OUTPATIENT_CLINIC_OR_DEPARTMENT_OTHER)
Admission: EM | Admit: 2022-11-02 | Discharge: 2022-11-02 | Disposition: A | Discharge status: Home / Self Care | Payer: Commercial Managed Care - HMO | Attending: Emergency Medicine | Admitting: Emergency Medicine

## 2022-11-02 DIAGNOSIS — Z7984 Long term (current) use of oral hypoglycemic drugs: Secondary | ICD-10-CM | POA: Insufficient documentation

## 2022-11-02 DIAGNOSIS — R0981 Nasal congestion: Secondary | ICD-10-CM | POA: Insufficient documentation

## 2022-11-02 MED ORDER — IPRATROPIUM BROMIDE 0.03 % NA SOLN
2.0000 | Freq: Two times a day (BID) | NASAL | 12 refills | Status: DC
  Filled 2022-11-02: qty 30, 43d supply, fill #0

## 2022-11-02 MED ORDER — FLUTICASONE PROPIONATE 50 MCG/ACT NA SUSP
2.0000 | Freq: Every day | NASAL | 2 refills | Status: DC
  Filled 2022-11-02: qty 16, 30d supply, fill #0

## 2022-11-02 NOTE — ED Provider Notes (Signed)
[place] [facility] Provider Note   CSN: [id number] Arrival date & time: 11/02/22  1313     History  Chief Complaint  Patient presents with   Nasal Congestion    Amanda Gamble is a 52 y.o. female.  Pt complains of nasal congestion.  Pt has an appointment to see Dr. Suszanne Conners Ent next week.  Pt reports she has been on 3 different antibiotics and steroids.  Pt primary is concerned that she has polyps.  Pt wants to have [place] suck the congestion out of her sinuses.  Pt's primary did an xray.  Pt denies fever or chills.  No cough or shortness of breath.          Home Medications Prior to Admission medications   Medication Sig Start Date End Date Taking? Authorizing Provider  fluticasone (FLONASE) 50 MCG/ACT nasal spray Place 2 sprays into both nostrils daily. 11/02/22 11/02/23 Yes Cheron Schaumann K, PA-C  ipratropium (ATROVENT) 0.03 % nasal spray Place 2 sprays into both nostrils every 12 (twelve) hours. 11/02/22  Yes Elson Areas, PA-C  acetaminophen (TYLENOL) 500 MG tablet Take 1,000 mg by mouth 3 (three) times daily as needed for moderate pain.    [provider]  APPLE CIDER VINEGAR PO Take by mouth.    [provider]  Aspirin-Acetaminophen-Caffeine (PAMPRIN MAX PO) Take 2 tablets by mouth daily as needed (pain.).    [provider]  Chlorpheniramine Maleate (ALLERGY PO) Take 1 tablet by mouth daily.    [provider]  Ferrous Sulfate (IRON PO) Take by mouth.    [provider]  ibuprofen (IBU) 600 MG tablet Take 1 tablet (600 mg total) by mouth every 6 (six) hours as needed for moderate pain. 03/14/17   Fayrene Helper, PA-C  MAGNESIUM PO Take by mouth.    [provider]  Melatonin 5 MG CAPS Take 5 mg by mouth at bedtime as needed (sleep.).    [provider]  metFORMIN (GLUCOPHAGE-XR) 500 MG 24 hr tablet 2 tablets with evening meal 04/08/18   [provider]  Multiple Vitamin  (MULTIVITAMIN WITH MINERALS) TABS tablet Take 1 tablet by mouth daily.    [provider]  Probiotic Product (PROBIOTIC-10 PO) Take by mouth.    [provider]  sertraline (ZOLOFT) 100 MG tablet Take 1 tablet by mouth 2 (two) times daily. 07/22/20   [provider]      Allergies    Patient has no known allergies.    Review of Systems   Review of Systems  All other systems reviewed and are negative.   Physical Exam Updated Vital Signs BP (!) 147/103 (BP Location: Right Arm)   Pulse 85   Temp 98.2 F (36.8 C)   Resp 18   Ht 5\' 1"  (1.549 m)   Wt 73.5 kg   LMP 06/10/2015 (Approximate)   SpO2 100%   BMI 30.61 kg/m  Physical Exam Vitals and nursing note reviewed.  Constitutional:      Appearance: She is well-developed.  HENT:     Head: Normocephalic.     Nose: Congestion and rhinorrhea present.     Mouth/Throat:     Mouth: Mucous membranes are moist.  Cardiovascular:     Rate and Rhythm: Normal rate.  Pulmonary:     Effort: Pulmonary effort is normal.  Abdominal:     General: There is no distension.  Musculoskeletal:        General: Normal  range of motion.  Neurological:     Mental Status: She is alert and oriented to person, place, and time.  Psychiatric:        Mood and Affect: Mood normal.     ED Results / Procedures / Treatments   Labs (all labs ordered are listed, but only abnormal results are displayed) Labs Reviewed - No data to display  EKG None  Radiology No results found.  Procedures Procedures    Medications Ordered in ED Medications - No data to display  ED Course/ Medical Decision Making/ A&P                                 Medical Decision Making Pt complains of nasal congestion for 2 months.   Risk Prescription drug management. Risk Details: Pt offered ct of sinuses.  Pt declined.  I discussed with Respiratory Therapy who advised atrovent nasal spray and flonase to try to dry secretions   Pt advised to  keep follow up with Dr. Suszanne Conners          Final Clinical Impression(s) / ED Diagnoses Final diagnoses:  Nasal congestion    Rx / DC Orders ED Discharge Orders          Ordered    ipratropium (ATROVENT) 0.03 % nasal spray  Every 12 hours        11/02/22 1435    fluticasone (FLONASE) 50 MCG/ACT nasal spray  Daily        11/02/22 1435          An After Visit Summary was printed and given to the patient.     Elson Areas, PA-C 11/02/22 1444    Melene Plan, DO 11/02/22 1450

## 2022-11-02 NOTE — ED Triage Notes (Addendum)
Patient presents to ED via POV from home. Here with nasal congestion x 2 months. Reports being seen by 3 different tele-health providers. Seen also by PCP and told she needs to see a specialist.

## 2022-11-05 ENCOUNTER — Other Ambulatory Visit: Payer: Self-pay | Admitting: Otolaryngology

## 2022-11-05 DIAGNOSIS — J320 Chronic maxillary sinusitis: Secondary | ICD-10-CM

## 2022-11-23 ENCOUNTER — Other Ambulatory Visit: Payer: Commercial Managed Care - HMO

## 2022-11-25 ENCOUNTER — Ambulatory Visit
Admission: RE | Admit: 2022-11-25 | Discharge: 2022-11-25 | Disposition: A | Discharge status: Home / Self Care | Payer: Commercial Managed Care - HMO | Source: Ambulatory Visit | Attending: Otolaryngology | Admitting: Otolaryngology

## 2022-11-25 DIAGNOSIS — J320 Chronic maxillary sinusitis: Secondary | ICD-10-CM

## 2023-01-14 ENCOUNTER — Encounter (HOSPITAL_COMMUNITY): Payer: Self-pay | Admitting: Otolaryngology

## 2023-01-14 ENCOUNTER — Other Ambulatory Visit (INDEPENDENT_AMBULATORY_CARE_PROVIDER_SITE_OTHER): Payer: Self-pay | Admitting: Otolaryngology

## 2023-01-14 NOTE — Progress Notes (Signed)
SDW call  Patient was given pre-op instructions over the phone. Patient verbalized understanding of instructions provided.     PCP - Dr.  Irena Reichmann Cardiologist -  Pulmonary:    PPM/ICD - denies Device Orders - na Rep Notified - na   Chest x-ray - na EKG -  na Stress Test - ECHO -  Cardiac Cath -   Sleep Study/sleep apnea/CPAP: diagnosed with sleep apnea, does not uses CPAP  Non-diabetic  Blood Thinner Instructions: denies Aspirin Instructions:denies   ERAS Protcol - NPO   COVID TEST- na    Anesthesia review: No   Patient denies shortness of breath, fever, cough and chest pain over the phone call  Your procedure is scheduled on Friday January 15, 2023  Report to [facility] Main Entrance "A" at 1115 A.M., then check in with the Admitting office.  Call this number if you have problems the morning of surgery:  [phone]   If you have any questions prior to your surgery date call [phone]: Open Monday-Friday 8am-4pm If you experience any cold or flu symptoms such as cough, fever, chills, shortness of breath, etc. between now and your scheduled surgery, please notify us at the above number    Remember:  Do not eat or drink after midnight the night before your surgery  Take these medicines the morning of surgery with A SIP OF WATER:  Allegra, crestor, zoloft  As of today, STOP taking any Aspirin (unless otherwise instructed by your surgeon) Aleve, Naproxen, Ibuprofen, Motrin, Advil, Goody's, BC's, all herbal medications, fish oil, and all vitamins.

## 2023-01-15 ENCOUNTER — Ambulatory Visit (HOSPITAL_COMMUNITY)
Admission: RE | Admit: 2023-01-15 | Discharge: 2023-01-15 | Disposition: A | Discharge status: Home / Self Care | Payer: Commercial Managed Care - HMO | Attending: Otolaryngology | Admitting: Otolaryngology

## 2023-01-15 ENCOUNTER — Encounter (HOSPITAL_COMMUNITY)
Admission: RE | Disposition: A | Discharge status: Home / Self Care | Payer: Self-pay | Source: Home / Self Care | Attending: Otolaryngology

## 2023-01-15 ENCOUNTER — Encounter (HOSPITAL_COMMUNITY): Payer: Self-pay | Admitting: Otolaryngology

## 2023-01-15 ENCOUNTER — Ambulatory Visit (HOSPITAL_COMMUNITY): Payer: Commercial Managed Care - HMO | Admitting: Anesthesiology

## 2023-01-15 ENCOUNTER — Other Ambulatory Visit: Payer: Self-pay

## 2023-01-15 ENCOUNTER — Ambulatory Visit (HOSPITAL_BASED_OUTPATIENT_CLINIC_OR_DEPARTMENT_OTHER): Payer: Commercial Managed Care - HMO | Admitting: Anesthesiology

## 2023-01-15 DIAGNOSIS — J321 Chronic frontal sinusitis: Secondary | ICD-10-CM | POA: Diagnosis not present

## 2023-01-15 DIAGNOSIS — J342 Deviated nasal septum: Secondary | ICD-10-CM | POA: Diagnosis not present

## 2023-01-15 DIAGNOSIS — J338 Other polyp of sinus: Secondary | ICD-10-CM

## 2023-01-15 DIAGNOSIS — G4730 Sleep apnea, unspecified: Secondary | ICD-10-CM | POA: Diagnosis not present

## 2023-01-15 DIAGNOSIS — J343 Hypertrophy of nasal turbinates: Secondary | ICD-10-CM | POA: Diagnosis not present

## 2023-01-15 DIAGNOSIS — F17210 Nicotine dependence, cigarettes, uncomplicated: Secondary | ICD-10-CM | POA: Insufficient documentation

## 2023-01-15 DIAGNOSIS — J3489 Other specified disorders of nose and nasal sinuses: Secondary | ICD-10-CM | POA: Insufficient documentation

## 2023-01-15 DIAGNOSIS — J322 Chronic ethmoidal sinusitis: Secondary | ICD-10-CM

## 2023-01-15 DIAGNOSIS — E669 Obesity, unspecified: Secondary | ICD-10-CM | POA: Insufficient documentation

## 2023-01-15 DIAGNOSIS — Z6831 Body mass index (BMI) 31.0-31.9, adult: Secondary | ICD-10-CM | POA: Diagnosis not present

## 2023-01-15 DIAGNOSIS — J320 Chronic maxillary sinusitis: Secondary | ICD-10-CM | POA: Diagnosis not present

## 2023-01-15 HISTORY — PX: NASAL SEPTOPLASTY W/ TURBINOPLASTY: SHX2070

## 2023-01-15 HISTORY — DX: Sleep apnea, unspecified: G47.30

## 2023-01-15 HISTORY — DX: Pure hypercholesterolemia, unspecified: E78.00

## 2023-01-15 HISTORY — PX: ETHMOIDECTOMY: SHX5197

## 2023-01-15 HISTORY — PX: MAXILLARY ANTROSTOMY: SHX2003

## 2023-01-15 HISTORY — PX: SINUS ENDO WITH FUSION: SHX5329

## 2023-01-15 LAB — CBC
HCT: 41.9 % (ref 36.0–46.0)
Hemoglobin: 14.1 g/dL (ref 12.0–15.0)
MCH: 29 pg (ref 26.0–34.0)
MCHC: 33.7 g/dL (ref 30.0–36.0)
MCV: 86 fL (ref 80.0–100.0)
Platelets: 308 10*3/uL (ref 150–400)
RBC: 4.87 MIL/uL (ref 3.87–5.11)
RDW: 12.6 % (ref 11.5–15.5)
WBC: 8.8 10*3/uL (ref 4.0–10.5)
nRBC: 0 % (ref 0.0–0.2)

## 2023-01-15 SURGERY — SEPTOPLASTY, NOSE, WITH NASAL TURBINATE REDUCTION
Anesthesia: General | Laterality: Right

## 2023-01-15 MED ORDER — OXYCODONE HCL 5 MG/5ML PO SOLN: 5.0000 mg | Freq: Once | ORAL | Status: AC | PRN

## 2023-01-15 MED ORDER — LACTATED RINGERS IV SOLN: INTRAVENOUS | Status: DC

## 2023-01-15 MED ORDER — BACITRACIN-NEOMYCIN-POLYMYXIN OINTMENT TUBE
TOPICAL_OINTMENT | CUTANEOUS | Status: AC
  Filled 2023-01-15: qty 14.17

## 2023-01-15 MED ORDER — DEXMEDETOMIDINE HCL IN NACL 200 MCG/50ML IV SOLN
INTRAVENOUS | Status: DC | PRN
  Administered 2023-01-15 (×4): 10 ug via INTRAVENOUS

## 2023-01-15 MED ORDER — FENTANYL CITRATE (PF) 100 MCG/2ML IJ SOLN
INTRAMUSCULAR | Status: DC | PRN
  Administered 2023-01-15: 25 ug via INTRAVENOUS
  Administered 2023-01-15: 100 ug via INTRAVENOUS
  Administered 2023-01-15: 25 ug via INTRAVENOUS

## 2023-01-15 MED ORDER — SODIUM CHLORIDE 0.9 % IR SOLN
Status: DC | PRN
  Administered 2023-01-15: 1000 mL

## 2023-01-15 MED ORDER — PHENYLEPHRINE HCL (PRESSORS) 10 MG/ML IV SOLN
INTRAVENOUS | Status: DC | PRN
  Administered 2023-01-15: 80 ug via INTRAVENOUS

## 2023-01-15 MED ORDER — ACETAMINOPHEN 10 MG/ML IV SOLN
1000.0000 mg | Freq: Once | INTRAVENOUS | Status: AC
  Administered 2023-01-15: 1000 mg via INTRAVENOUS

## 2023-01-15 MED ORDER — PROPOFOL 10 MG/ML IV BOLUS
INTRAVENOUS | Status: DC | PRN
  Administered 2023-01-15 (×2): 30 mg via INTRAVENOUS
  Administered 2023-01-15: 200 mg via INTRAVENOUS

## 2023-01-15 MED ORDER — DEXAMETHASONE SODIUM PHOSPHATE 4 MG/ML IJ SOLN
INTRAMUSCULAR | Status: DC | PRN
  Administered 2023-01-15: 4 mg via INTRAVENOUS

## 2023-01-15 MED ORDER — BACITRACIN-NEOMYCIN-POLYMYXIN OINTMENT TUBE
TOPICAL_OINTMENT | CUTANEOUS | Status: DC | PRN
  Administered 2023-01-15: 1 via TOPICAL

## 2023-01-15 MED ORDER — AMOXICILLIN 875 MG PO TABS: 875.0000 mg | ORAL_TABLET | Freq: Two times a day (BID) | ORAL | 0 refills | Status: AC

## 2023-01-15 MED ORDER — BACITRACIN ZINC 500 UNIT/GM EX OINT
TOPICAL_OINTMENT | CUTANEOUS | Status: AC
  Filled 2023-01-15: qty 28.35

## 2023-01-15 MED ORDER — MIDAZOLAM HCL 5 MG/5ML IJ SOLN
INTRAMUSCULAR | Status: DC | PRN
  Administered 2023-01-15: 2 mg via INTRAVENOUS

## 2023-01-15 MED ORDER — CEFAZOLIN SODIUM-DEXTROSE 2-3 GM-%(50ML) IV SOLR
INTRAVENOUS | Status: DC | PRN
  Administered 2023-01-15: 2 g via INTRAVENOUS

## 2023-01-15 MED ORDER — OXYCODONE HCL 5 MG PO TABS
5.0000 mg | ORAL_TABLET | Freq: Once | ORAL | Status: AC | PRN
  Administered 2023-01-15: 5 mg via ORAL

## 2023-01-15 MED ORDER — ORAL CARE MOUTH RINSE: 15.0000 mL | Freq: Once | OROMUCOSAL | Status: AC

## 2023-01-15 MED ORDER — 0.9 % SODIUM CHLORIDE (POUR BTL) OPTIME
TOPICAL | Status: DC | PRN
  Administered 2023-01-15: 1000 mL

## 2023-01-15 MED ORDER — FENTANYL CITRATE (PF) 250 MCG/5ML IJ SOLN
INTRAMUSCULAR | Status: AC
  Filled 2023-01-15: qty 5

## 2023-01-15 MED ORDER — CHLORHEXIDINE GLUCONATE 0.12 % MT SOLN
15.0000 mL | Freq: Once | OROMUCOSAL | Status: AC
  Administered 2023-01-15: 15 mL via OROMUCOSAL
  Filled 2023-01-15: qty 15

## 2023-01-15 MED ORDER — LIDOCAINE-EPINEPHRINE 1 %-1:100000 IJ SOLN
INTRAMUSCULAR | Status: AC
  Filled 2023-01-15: qty 1

## 2023-01-15 MED ORDER — ESMOLOL HCL 100 MG/10ML IV SOLN
INTRAVENOUS | Status: DC | PRN
  Administered 2023-01-15: 20 mg via INTRAVENOUS

## 2023-01-15 MED ORDER — LIDOCAINE 2% (20 MG/ML) 5 ML SYRINGE
INTRAMUSCULAR | Status: DC | PRN
  Administered 2023-01-15: 60 mg via INTRAVENOUS

## 2023-01-15 MED ORDER — OXYMETAZOLINE HCL 0.05 % NA SOLN
NASAL | Status: DC | PRN
  Administered 2023-01-15: 1

## 2023-01-15 MED ORDER — OXYCODONE HCL 5 MG PO TABS
ORAL_TABLET | ORAL | Status: AC
  Filled 2023-01-15: qty 1

## 2023-01-15 MED ORDER — LIDOCAINE-EPINEPHRINE 1 %-1:100000 IJ SOLN
INTRAMUSCULAR | Status: DC | PRN
  Administered 2023-01-15: 4 mL

## 2023-01-15 MED ORDER — HYDROMORPHONE HCL 1 MG/ML IJ SOLN
INTRAMUSCULAR | Status: AC
  Filled 2023-01-15: qty 1

## 2023-01-15 MED ORDER — HYDRALAZINE HCL 20 MG/ML IJ SOLN
INTRAMUSCULAR | Status: DC | PRN
  Administered 2023-01-15: 10 mg via INTRAVENOUS

## 2023-01-15 MED ORDER — ONDANSETRON HCL 4 MG/2ML IJ SOLN
INTRAMUSCULAR | Status: DC | PRN
  Administered 2023-01-15: 4 mg via INTRAVENOUS

## 2023-01-15 MED ORDER — AMISULPRIDE (ANTIEMETIC) 5 MG/2ML IV SOLN
10.0000 mg | Freq: Once | INTRAVENOUS | Status: AC | PRN
  Administered 2023-01-15: 10 mg via INTRAVENOUS

## 2023-01-15 MED ORDER — MIDAZOLAM HCL 2 MG/2ML IJ SOLN
INTRAMUSCULAR | Status: AC
  Filled 2023-01-15: qty 2

## 2023-01-15 MED ORDER — ONDANSETRON HCL 4 MG/2ML IJ SOLN: 4.0000 mg | Freq: Once | INTRAMUSCULAR | Status: DC | PRN

## 2023-01-15 MED ORDER — ACETAMINOPHEN 10 MG/ML IV SOLN
INTRAVENOUS | Status: AC
  Filled 2023-01-15: qty 100

## 2023-01-15 MED ORDER — ACETAMINOPHEN 500 MG PO TABS
1000.0000 mg | ORAL_TABLET | Freq: Once | ORAL | Status: AC
  Administered 2023-01-15: 1000 mg via ORAL
  Filled 2023-01-15: qty 2

## 2023-01-15 MED ORDER — ROCURONIUM BROMIDE 10 MG/ML (PF) SYRINGE
PREFILLED_SYRINGE | INTRAVENOUS | Status: DC | PRN
  Administered 2023-01-15: 60 mg via INTRAVENOUS

## 2023-01-15 MED ORDER — AMISULPRIDE (ANTIEMETIC) 5 MG/2ML IV SOLN
INTRAVENOUS | Status: AC
  Filled 2023-01-15: qty 4

## 2023-01-15 MED ORDER — OXYMETAZOLINE HCL 0.05 % NA SOLN
NASAL | Status: AC
  Filled 2023-01-15: qty 30

## 2023-01-15 MED ORDER — HYDROMORPHONE HCL 1 MG/ML IJ SOLN
0.2500 mg | INTRAMUSCULAR | Status: DC | PRN
  Administered 2023-01-15 (×2): 0.5 mg via INTRAVENOUS

## 2023-01-15 SURGICAL SUPPLY — 56 items
AGENT HMST KT MTR STRL THRMB (HEMOSTASIS)
BAG COUNTER SPONGE SURGICOUNT (BAG) ×3 IMPLANT
BAG SPNG CNTER NS LX DISP (BAG) ×3
BLADE NAVIG QUADCUT 4.3X13 M4 (BLADE) IMPLANT
BLADE RAD40 ROTATE 4M 4 5PK (BLADE) IMPLANT
BLADE RAD60 ROTATE M4 4 5PK (BLADE) IMPLANT
BLADE ROTATE TRICUT 4X13 M4 (BLADE) ×3 IMPLANT
BLADE SURG 15 STRL LF DISP TIS (BLADE) IMPLANT
BLADE SURG 15 STRL SS (BLADE)
BLADE TRICUT ROTATE M4 4 5PK (BLADE) IMPLANT
CANISTER SUCT 3000ML PPV (MISCELLANEOUS) ×3 IMPLANT
CNTNR URN SCR LID CUP LEK RST (MISCELLANEOUS) ×3 IMPLANT
COAGULATOR SUCT SWTCH 10FR 6 (ELECTROSURGICAL) ×3 IMPLANT
CONT SPEC 4OZ STRL OR WHT (MISCELLANEOUS) ×3
COVER SURGICAL LIGHT HANDLE (MISCELLANEOUS) IMPLANT
DRAPE HALF SHEET 40X57 (DRAPES) IMPLANT
DRAPE SHEET LG 3/4 BI-LAMINATE (DRAPES) IMPLANT
DRSG NASOPORE 8CM (GAUZE/BANDAGES/DRESSINGS) IMPLANT
ELECT COATED BLADE 2.86 ST (ELECTRODE) IMPLANT
ELECT REM PT RETURN 9FT ADLT (ELECTROSURGICAL) ×3
ELECTRODE REM PT RTRN 9FT ADLT (ELECTROSURGICAL) ×3 IMPLANT
FILTER ARTHROSCOPY CONVERTOR (FILTER) ×3 IMPLANT
GAUZE 4X4 16PLY ~~LOC~~+RFID DBL (SPONGE) ×3 IMPLANT
GAUZE SPONGE 2X2 8PLY STRL LF (GAUZE/BANDAGES/DRESSINGS) ×3 IMPLANT
GLOVE ECLIPSE 7.5 STRL STRAW (GLOVE) ×3 IMPLANT
GOWN STRL REUS W/ TWL LRG LVL3 (GOWN DISPOSABLE) ×6 IMPLANT
GOWN STRL REUS W/TWL LRG LVL3 (GOWN DISPOSABLE) ×6
KIT BASIN OR (CUSTOM PROCEDURE TRAY) ×3 IMPLANT
KIT TURNOVER KIT B (KITS) ×3 IMPLANT
NDL HYPO 25GX1X1/2 BEV (NEEDLE) ×3 IMPLANT
NDL SPNL 25GX3.5 QUINCKE BL (NEEDLE) ×3 IMPLANT
NEEDLE HYPO 25GX1X1/2 BEV (NEEDLE) ×3
NEEDLE SPNL 25GX3.5 QUINCKE BL (NEEDLE) ×3
NS IRRIG 1000ML POUR BTL (IV SOLUTION) ×3 IMPLANT
PACK EENT II TURBAN DRAPE (CUSTOM PROCEDURE TRAY) ×3 IMPLANT
PAD ARMBOARD 7.5X6 YLW CONV (MISCELLANEOUS) ×6 IMPLANT
PENCIL SMOKE EVACUATOR (MISCELLANEOUS) ×3 IMPLANT
POSITIONER HEAD DONUT 9IN (MISCELLANEOUS) ×3 IMPLANT
SOL ANTI FOG 6CC (MISCELLANEOUS) IMPLANT
SPLINT NASAL DOYLE BI-VL (GAUZE/BANDAGES/DRESSINGS) ×3 IMPLANT
SPONGE NEURO XRAY DETECT 1X3 (DISPOSABLE) ×3 IMPLANT
SURGIFLO W/THROMBIN 8M KIT (HEMOSTASIS) IMPLANT
SUT CHROMIC 4 0 SH 27 (SUTURE) ×3 IMPLANT
SUT PLAIN 4 0 ~~LOC~~ 1 (SUTURE) ×3 IMPLANT
SUT PROLENE 3 0 PS 2 (SUTURE) ×3 IMPLANT
SYR CONTROL 10ML LL (SYRINGE) IMPLANT
TOWEL GREEN STERILE FF (TOWEL DISPOSABLE) ×3 IMPLANT
TRACKER ENT INSTRUMENT (MISCELLANEOUS) ×3 IMPLANT
TRACKER ENT PATIENT (MISCELLANEOUS) ×3 IMPLANT
TRAY ENT MC OR (CUSTOM PROCEDURE TRAY) ×3 IMPLANT
TUBE CONNECTING 12X1/4 (SUCTIONS) ×3 IMPLANT
TUBE SALEM SUMP 16F (TUBING) ×3 IMPLANT
TUBING EXTENTION W/L.L. (IV SETS) ×3 IMPLANT
TUBING STRAIGHTSHOT EPS 5PK (TUBING) ×3 IMPLANT
WATER STERILE IRR 1000ML POUR (IV SOLUTION) ×3 IMPLANT
YANKAUER SUCT BULB TIP NO VENT (SUCTIONS) ×3 IMPLANT

## 2023-01-15 NOTE — Op Note (Signed)
DATE OF PROCEDURE: 01/15/2023  OPERATIVE REPORT   SURGEON: Newman Pies, MD   PREOPERATIVE DIAGNOSES:  1. Severe nasal septal deviation.  2. Bilateral inferior turbinate hypertrophy.  3. Chronic nasal obstruction. 4. Bilateral chronic maxillary sinusitis and polyposis 5. Right chronic frontal and ethmoid sinusitis and polyposis  POSTOPERATIVE DIAGNOSES:  1. Severe nasal septal deviation.  2. Bilateral inferior turbinate hypertrophy.  3. Chronic nasal obstruction. 4. Bilateral chronic maxillary sinusitis and polyposis 5. Right chronic frontal and ethmoid sinusitis and polyposis  PROCEDURE PERFORMED:  1. Septoplasty.  2. Bilateral partial inferior turbinate resection.  3. Bilateral endoscopic maxillary antrostomy with polyp removal 4. Right endoscopic total ethmoidectomy and frontal sinusotomy with polyp removal 5. FUSION stereotactic image guidance  ANESTHESIA: General endotracheal tube anesthesia.   COMPLICATIONS: None.   ESTIMATED BLOOD LOSS: 200 mL.   INDICATION FOR PROCEDURE: THEARSA GRANGER is a 52 y.o. female with a history of frequent recurrent sinusitis and chronic nasal obstruction.  She was treated with at least 4 courses of antibiotics over the past 2 months.  She was also treated with prednisone, Flonase, and Claritin.  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  She was placed on high-dose prednisone, Flonase, and Claritin.  She subsequently underwent a sinus CT scan.  The CT scan showed chronic rhinosinusitis, involving bilateral maxillary, right ethmoid, and right frontal sinuses.  Hyperdense tissue was also noted within the right maxillary sinus, suggesting of possible fungus ball.  In addition, she also has significant nasal septal deviation and bilateral inferior turbinate hypertrophy.   Based on the above findings, the decision was made for the patient to undergo the above-stated  procedures. The risks, benefits, alternatives, and details of the procedures were discussed with the patient. Questions were invited and answered. Informed consent was obtained.   DESCRIPTION OF PROCEDURE: The patient was taken to the operating room and placed supine on the operating table. General endotracheal tube anesthesia was administered by the anesthesiologist. The patient was positioned, and prepped and draped in the standard fashion for nasal surgery. Pledgets soaked with Afrin were placed in both nasal cavities for decongestion. The pledgets were subsequently removed. The FUSION stereotactic image guidance marker was placed. The image guidance system was functional throughout the case.  Examination of the nasal cavity revealed a severe nasal septal deviation. 1% lidocaine with 1:100,000 epinephrine was injected onto the nasal septum bilaterally. A hemitransfixion incision was made on the left side. The mucosal flap was carefully elevated on the left side. A cartilaginous incision was made 1 cm superior to the caudal margin of the nasal septum. Mucosal flap was also elevated on the right side in the similar fashion. It should be noted that due to the severe septal deviation, the deviated portion of the cartilaginous and bony septum had to be removed in piecemeal fashion. Once the deviated portions were removed, a straight midline septum was achieved. The septum was then quilted with 4-0 plain gut sutures. The hemitransfixion incision was closed with interrupted 4-0 chromic sutures.   The inferior one half of both hypertrophied inferior turbinate was crossclamped with a Kelly clamp. The inferior one half of each inferior turbinate was then resected with a pair of cross cutting scissors. Hemostasis was achieved with a suction cautery device.   Using a 0 endoscope, the left nasal cavity was examined. A large middle turbinate was noted. Using Tru-Cut forceps, the inferior one third and medial  one half  of the middle turbinate was resected. Polypoid tissue was noted within the middle meatus. The polypoid tissue was removed using a combination of microdebrider and Blakesley forceps. The uncinate process was resected with a freer elevator. The maxillary antrum was entered and enlarged using a combination of backbiter and microdebrider. Polypoid tissue was removed from the left maxillary sinus.  The left maxillary sinus was copiously irrigated.  Attention was then focused on the right nasal cavity.  The same procedure was repeated on the right side without exception.  The patient was again noted to have a large amount of polypoid tissue within the right maxillary sinus.  The polypoid tissue was removed.  Attention was then focused on the ethmoid sinuses. The bony partitions of the anterior and posterior ethmoid cavities were taken down. Polypoid tissue was noted and removed.  Attention was then focused on the frontal sinus. The frontal recess was identified and enlarged by removing the surrounding bony partitions. Polypoid tissue was removed from the frontal recess. The sinuses were copiously irrigated with saline solution. Doyle splints were applied to the nasal septum.  The care of the patient was turned over to the anesthesiologist. The patient was awakened from anesthesia without difficulty. The patient was extubated and transferred to the recovery room in good condition.   OPERATIVE FINDINGS: Severe nasal septal deviation and bilateral inferior turbinate hypertrophy.  Bilateral chronic maxillary sinusitis and polyposis.  Right chronic ethmoid and frontal sinusitis and polyposis.  SPECIMEN: Bilateral sinonasal contents.   FOLLOWUP CARE: The patient be discharged home once she is awake and alert. The patient will follow up in my office in 3 days for splint removal.   Roise Emert Philomena Doheny, MD

## 2023-01-15 NOTE — Anesthesia Procedure Notes (Signed)
Procedure Name: Intubation Date/Time: 01/15/2023 2:25 PM  Performed by: Montez Morita, Arleth Mccullar W, CRNAPre-anesthesia Checklist: Patient identified, Emergency Drugs available, Suction available and Patient being monitored Patient Re-evaluated:Patient Re-evaluated prior to induction Oxygen Delivery Method: Circle system utilized Preoxygenation: Pre-oxygenation with 100% oxygen Induction Type: IV induction Ventilation: Mask ventilation without difficulty Laryngoscope Size: Glidescope and 3 Grade View: Grade I Tube type: Oral Number of attempts: 1 Airway Equipment and Method: Rigid stylet and Video-laryngoscopy Placement Confirmation: ETT inserted through vocal cords under direct vision, positive ETCO2 and breath sounds checked- equal and bilateral Secured at: 22 cm Tube secured with: Tape Dental Injury: Teeth and Oropharynx as per pre-operative assessment

## 2023-01-15 NOTE — Transfer of Care (Signed)
Immediate Anesthesia Transfer of Care Note  Patient: Amanda Gamble  Procedure(s) Performed: NASAL SEPTOPLASTY WITH BILATERAL TURBINATE REDUCTION (Bilateral) BILATERAL ENDOSCOPIC MAXILLARY ANTROSTOMY WITH TISSUE REMOVAL (Bilateral) RIGHT TOTAL ETHMOIDECTOMY,RIGHT FRONTAL RECESS (Right) SINUS ENDO WITH FUSION  Patient Location: PACU  Anesthesia Type:General  Level of Consciousness: sedated and drowsy  Airway & Oxygen Therapy: Patient Spontanous Breathing and Patient connected to face mask oxygen  Post-op Assessment: Report given to RN and Post -op Vital signs reviewed and stable  Post vital signs: Reviewed and stable  Last Vitals:  Vitals Value Taken Time  BP 112/48 01/15/23 1613  Temp 97.4   Pulse 74 01/15/23 1615  Resp 26 01/15/23 1615  SpO2 95 % 01/15/23 1615  Vitals shown include unfiled device data.  Last Pain:  Vitals:   01/15/23 1139  TempSrc:   PainSc: 0-No pain      Patients Stated Pain Goal: 0 (01/15/23 1139)  Complications: No notable events documented.

## 2023-01-15 NOTE — H&P (Signed)
Cc: Chronic nasal obstruction, chronic rhinosinusitis  HPI: The patient is a 52 year old female who returns today for her follow-up evaluation.  The patient was previously seen for frequent recurrent sinusitis and chronic nasal obstruction.  She was treated with at least 4 courses of antibiotics over the past 2 months.  She was also treated with prednisone, Flonase, and Claritin.  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  She was placed on high-dose prednisone, Flonase, and Claritin.  She subsequently underwent a sinus CT scan.  The CT scan showed chronic rhinosinusitis, involving bilateral maxillary, right ethmoid, and right frontal sinuses.  Hyperdense tissue was also noted within the right maxillary sinus, suggesting of possible fungus ball.  In addition, she also has significant nasal septal deviation and bilateral inferior turbinate hypertrophy.  The patient returns today complaining of persistent nasal obstruction and facial pressure.  She is interested in more definitive treatment.  Exam: General: Communicates without difficulty, well nourished, no acute distress. Head: Normocephalic, no evidence injury, no tenderness, facial buttresses intact without stepoff. Face/sinus: No tenderness to palpation and percussion. Facial movement is normal and symmetric. Eyes: PERRL, EOMI. No scleral icterus, conjunctivae clear. Neuro: CN II exam reveals vision grossly intact.  No nystagmus at any point of gaze. Ears: Auricles well formed without lesions.  Ear canals are intact without mass or lesion.  No erythema or edema is appreciated.  The TMs are intact without fluid. Nose: External evaluation reveals normal support and skin without lesions.  Dorsum is intact.  Anterior rhinoscopy reveals congested mucosa over anterior aspect of inferior turbinates and deviated septum.  No purulence noted. Oral:  Oral cavity and oropharynx are  intact, symmetric, without erythema or edema.  Mucosa is moist without lesions. Neck: Full range of motion without pain.  There is no significant lymphadenopathy.  No masses palpable.  Thyroid bed within normal limits to palpation.  Parotid glands and submandibular glands equal bilaterally without mass.  Trachea is midline. Neuro:  CN 2-12 grossly intact.   Assessment  1.  Bilateral chronic rhinosinusitis, involving bilateral maxillary, right frontal, and right ethmoid sinuses. 2.  Bilateral sinonasal polyps, nasal septal deviation, and bilateral inferior turbinate hypertrophy. 3.  The patient has not responded to maximal medical therapy for the past 3+ months.  Plan  1.  The physical exam findings and the CT images are extensively reviewed with the patient. 2.  Continue with her current allergy treatment regimen. 3.  In light of her persistent symptoms, she may benefit from surgical intervention with bilateral endoscopic sinus surgery, septoplasty, and turbinate reduction.  The risk, benefits, and details of the procedures are reviewed with the patient.  Questions are invited and answered. 4.  The patient will like to proceed with the procedures.

## 2023-01-15 NOTE — Discharge Instructions (Addendum)

## 2023-01-15 NOTE — Anesthesia Preprocedure Evaluation (Addendum)
Anesthesia Evaluation  Patient identified by MRN, date of birth, ID band Patient awake    Reviewed: Allergy & Precautions, NPO status , Patient's Chart, lab work & pertinent test results  Airway Mallampati: IV  TM Distance: >3 FB Neck ROM: Full    Dental  (+) Teeth Intact, Dental Advisory Given   Pulmonary sleep apnea , Current SmokerPatient did not abstain from smoking. 5-10cigg/d   Pulmonary exam normal breath sounds clear to auscultation       Cardiovascular negative cardio ROS Normal cardiovascular exam Rhythm:Regular Rate:Normal     Neuro/Psych  PSYCHIATRIC DISORDERS Anxiety Depression    negative neurological ROS     GI/Hepatic negative GI ROS, Neg liver ROS,,,  Endo/Other  Obesity BMI  31  Renal/GU negative Renal ROS  negative genitourinary   Musculoskeletal negative musculoskeletal ROS (+)    Abdominal  (+) + obese  Peds  Hematology negative hematology ROS (+)   Anesthesia Other Findings   Reproductive/Obstetrics negative OB ROS                             Anesthesia Physical Anesthesia Plan  ASA: 2  Anesthesia Plan: General   Post-op Pain Management: Tylenol PO (pre-op)*   Induction: Intravenous  PONV Risk Score and Plan: 4 or greater and Ondansetron, Dexamethasone, Midazolam and Treatment may vary due to age or medical condition  Airway Management Planned: Oral ETT and Video Laryngoscope Planned  Additional Equipment: None  Intra-op Plan:   Post-operative Plan: Extubation in OR  Informed Consent: I have reviewed the patients History and Physical, chart, labs and discussed the procedure including the risks, benefits and alternatives for the proposed anesthesia with the patient or authorized representative who has indicated his/her understanding and acceptance.     Dental advisory given  Plan Discussed with: CRNA  Anesthesia Plan Comments: (Has never been  intubated before, nonreassuring airway exam- glidescope available in room )       Anesthesia Quick Evaluation

## 2023-01-16 ENCOUNTER — Encounter (HOSPITAL_COMMUNITY): Payer: Self-pay | Admitting: Otolaryngology

## 2023-01-18 ENCOUNTER — Encounter (HOSPITAL_COMMUNITY): Payer: Self-pay | Admitting: Otolaryngology

## 2023-01-18 ENCOUNTER — Ambulatory Visit (INDEPENDENT_AMBULATORY_CARE_PROVIDER_SITE_OTHER): Payer: Managed Care, Other (non HMO) | Admitting: Otolaryngology

## 2023-01-18 VITALS — Ht 61.0 in | Wt 162.0 lb

## 2023-01-18 DIAGNOSIS — J320 Chronic maxillary sinusitis: Secondary | ICD-10-CM

## 2023-01-18 DIAGNOSIS — J3489 Other specified disorders of nose and nasal sinuses: Secondary | ICD-10-CM | POA: Diagnosis not present

## 2023-01-18 DIAGNOSIS — J338 Other polyp of sinus: Secondary | ICD-10-CM

## 2023-01-18 DIAGNOSIS — J321 Chronic frontal sinusitis: Secondary | ICD-10-CM

## 2023-01-18 DIAGNOSIS — J322 Chronic ethmoidal sinusitis: Secondary | ICD-10-CM

## 2023-01-18 DIAGNOSIS — R0981 Nasal congestion: Secondary | ICD-10-CM

## 2023-01-18 LAB — SURGICAL PATHOLOGY

## 2023-01-18 NOTE — Anesthesia Postprocedure Evaluation (Signed)
Anesthesia Post Note  Patient: Amanda Gamble  Procedure(s) Performed: NASAL SEPTOPLASTY WITH BILATERAL TURBINATE REDUCTION (Bilateral) BILATERAL ENDOSCOPIC MAXILLARY ANTROSTOMY WITH TISSUE REMOVAL (Bilateral) RIGHT TOTAL ETHMOIDECTOMY,RIGHT FRONTAL RECESS (Right) SINUS ENDO WITH FUSION     Patient location during evaluation: PACU Anesthesia Type: General Level of consciousness: awake and alert and oriented Pain management: pain level controlled Vital Signs Assessment: post-procedure vital signs reviewed and stable Respiratory status: spontaneous breathing, nonlabored ventilation and respiratory function stable Cardiovascular status: blood pressure returned to baseline and stable Postop Assessment: no apparent nausea or vomiting Anesthetic complications: no   No notable events documented.  Last Vitals:  Vitals:   01/15/23 1745 01/15/23 1800  BP: (!) 110/55 118/70  Pulse: 70 73  Resp: 17 14  Temp:  36.6 C  SpO2: 94% 94%    Last Pain:  Vitals:   01/15/23 1745  TempSrc:   PainSc: Asleep                 Ayan Heffington A.

## 2023-01-19 NOTE — Progress Notes (Signed)
Patient ID: Amanda Gamble, female   DOB: Sep 30, 1970, 52 y.o.   MRN: [id number]  Procedure: Bilateral nasal/sinus debridement status post endoscopic sinus surgery.  Indication: The patient previously underwent bilateral endoscopic sinus surgery to treat his/her chronic maxillary, ethmoid, and frontal sinusitis and polyposis.  The patient returns today complaining of significant nasal congestion and crusting.  She had a moderate amount of bleeding from the nasal cavities.  Currently the patient denies any facial pain, fever, or visual change.  Anesthesia: Topical Xylocaine and Neo-Synephrine.  Description: The patient is placed upright in the exam chair. Both nasal cavities are sprayed with topical Xylocaine and Neo-Synephrine.  A 0 rigid endoscope is used for the examination. The scope is first advanced past the right nostril into the right nasal cavity. A significant amount of crusting and blood clots are noted within the right nasal cavity and  the maxillary/ethmoid cavities.  The crusting and blood clots are removed with suction catheters and alligator forceps, which are inserted in parallel with the rigid endoscope.  After the debridement procedure, the maxillary antrum and the ethmoid cavities are noted to be widely patent.  The frontal sinus was also patent.  The same procedure is then repeated on the left side with similar findings. The patient tolerated the procedure well.  Follow up care: The patient is instructed to perform daily nasal saline irrigation.  The patient will return for re-evaluation in approximately 3 weeks.

## 2023-02-07 IMAGING — MR MR HEAD WO/W CM
7 of 13 series · 23 of 48 positions shown · IV contrast (gadavist)
Comparison: None.

CLINICAL DATA: Dizziness

EXAM:
MRI HEAD WITHOUT AND WITH CONTRAST
TECHNIQUE: Multiplanar, multiecho pulse sequences of the brain and surrounding
structures were obtained without and with intravenous contrast.
CONTRAST:  7.5mL GADAVIST GADOBUTROL 1 MMOL/ML IV SOLN

[Series 2: DWI · axial · 3.0mm · 0.94mm/px · z∈[-108,+52]mm · 7 of 110 slices shown (1 of 2)]
[im 1/110]
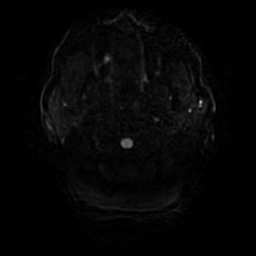
[im 19/110]
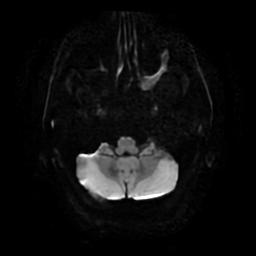
[im 37/110]
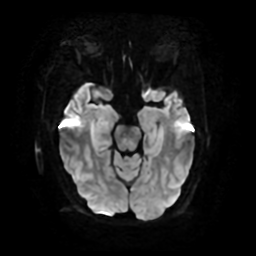
[im 55/110]
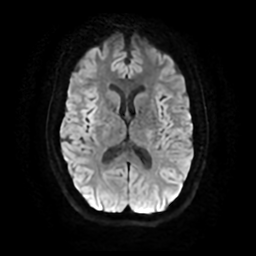
[im 73/110]
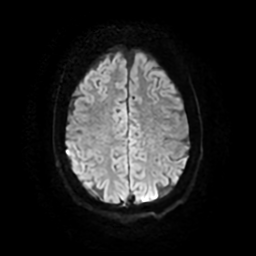
[im 91/110]
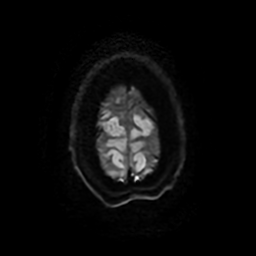
[im 110/110]
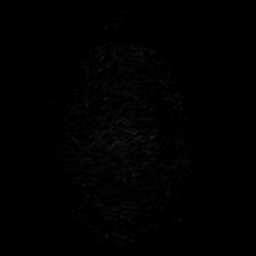

[Series 3: DWI · coronal · 4.0mm · 0.94mm/px · 5 of 76 slices shown (2 of 2)]
[im 1/76]
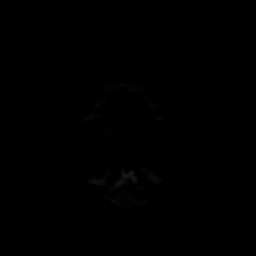
[im 19/76]
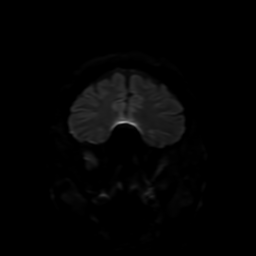
[im 38/76]
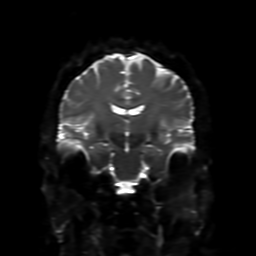
[im 57/76]
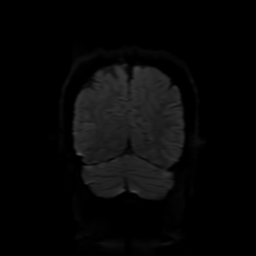
[im 76/76]
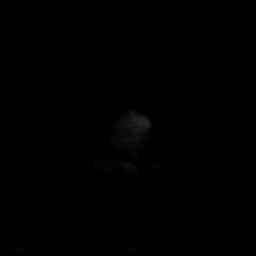

[Series 4: FLAIR · sagittal · 5.0mm · 0.23mm/px · 2 of 27 slices shown (1 of 2)]
[im 1/27]
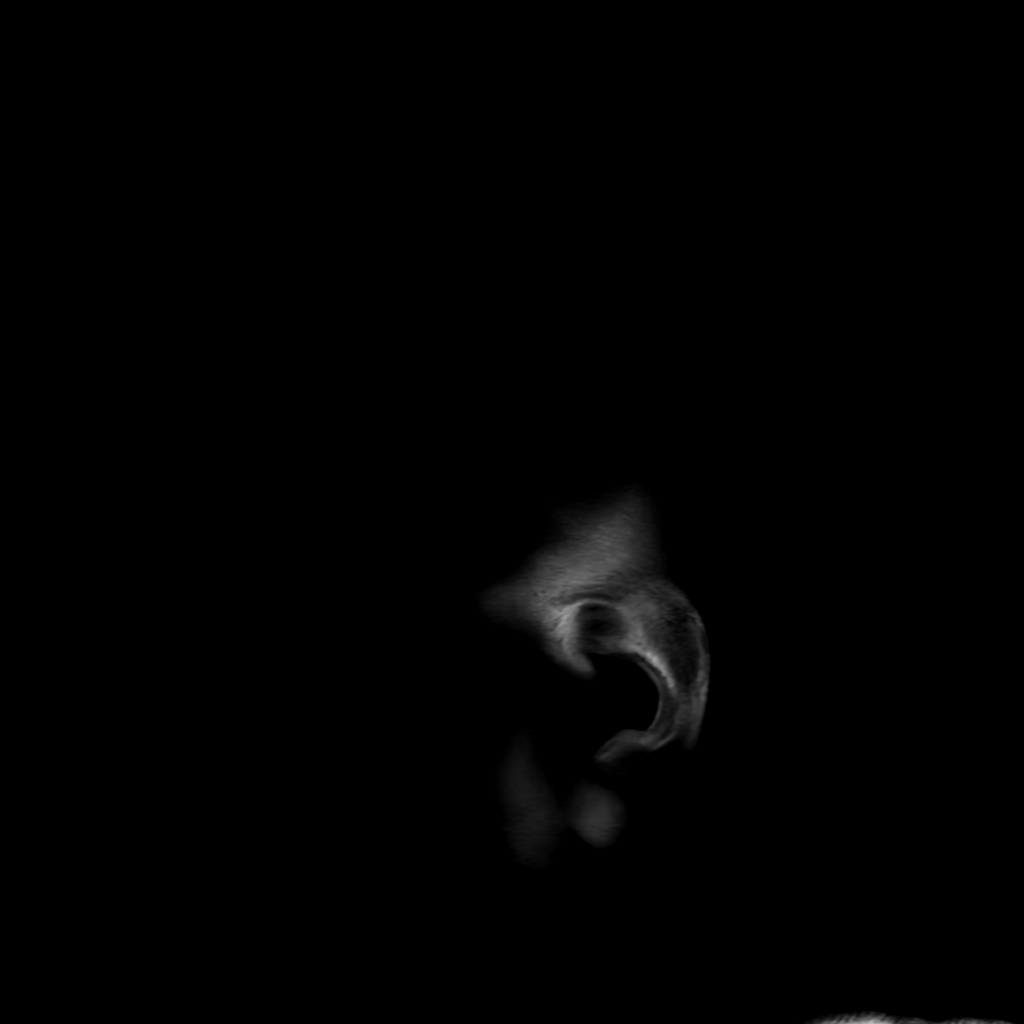
[im 27/27]
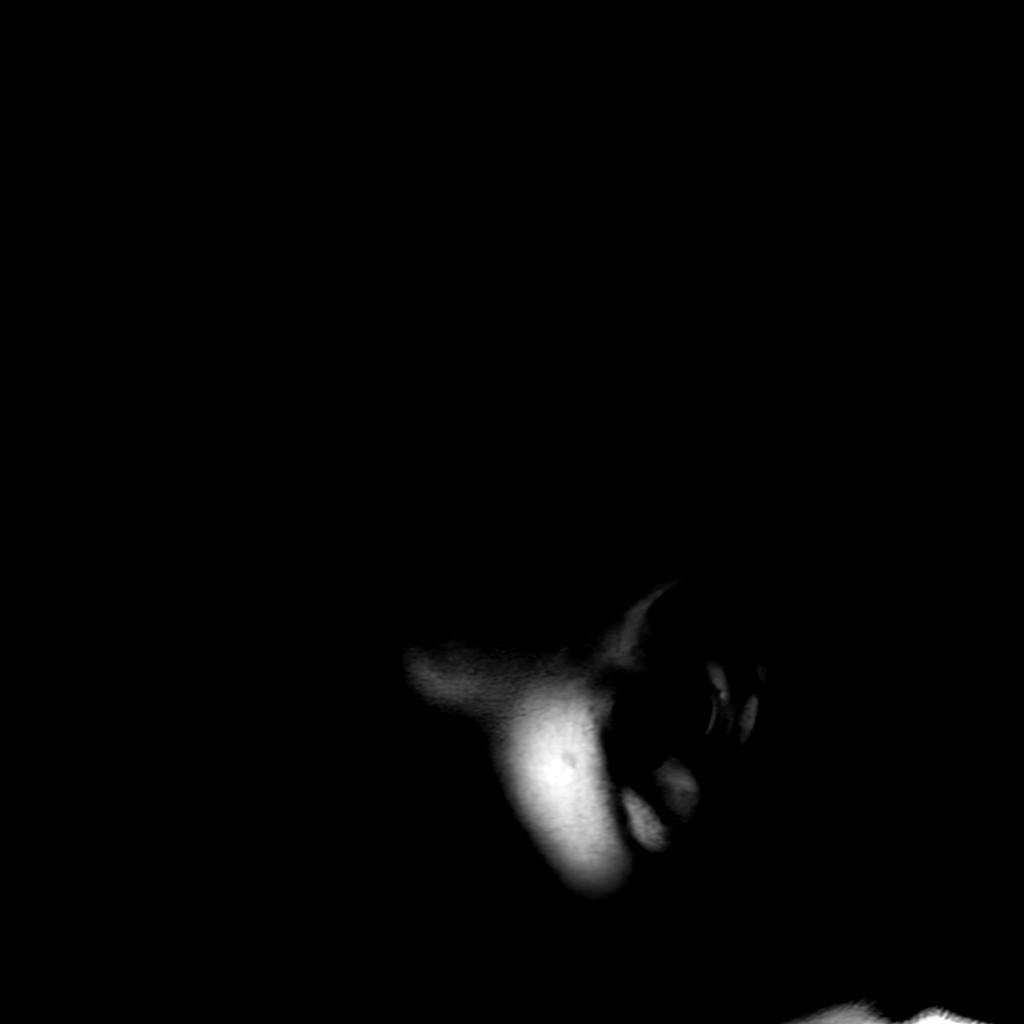

[Series 5: T2 · axial · 5.0mm · 0.23mm/px · 1 of 28 slices shown]
[im 1/28]
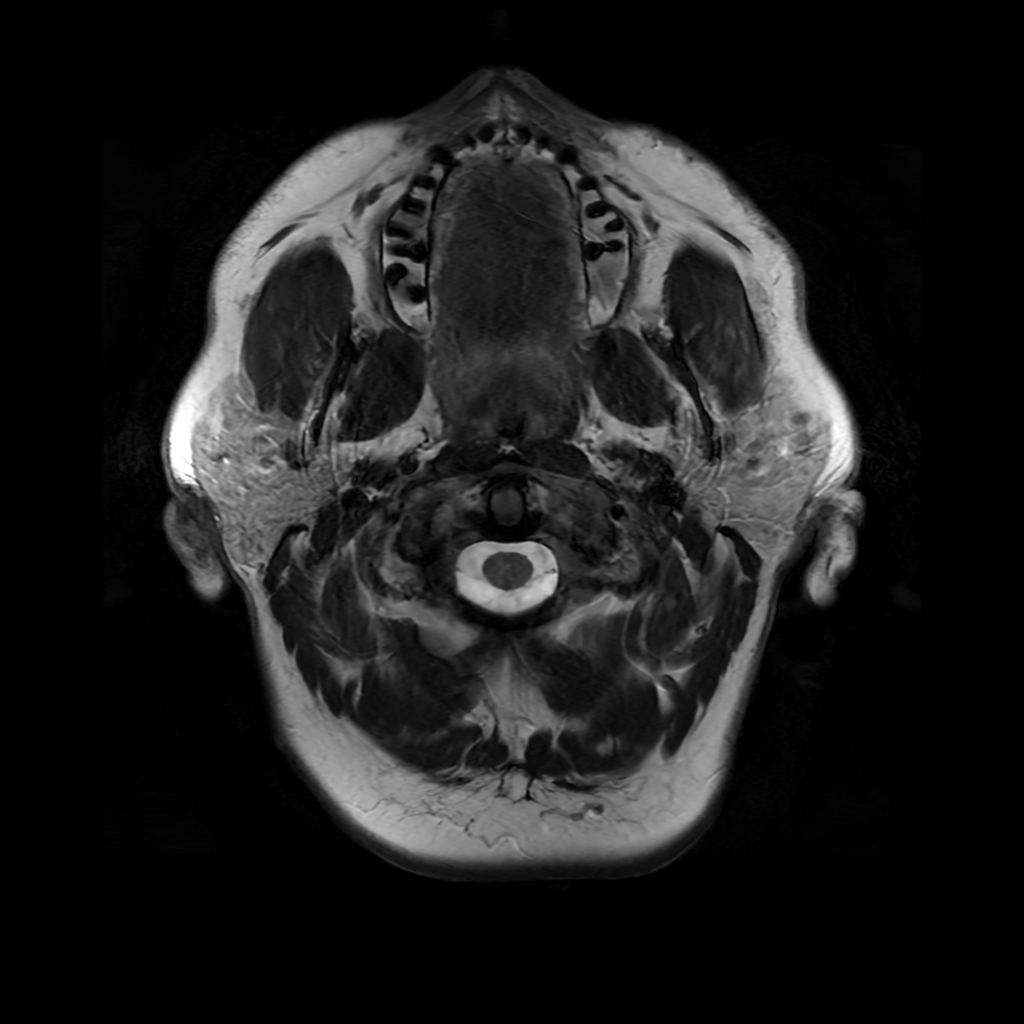

[Series 6: FLAIR · axial · 3.0mm · 0.45mm/px · z∈[-109,+51]mm · 2 of 28 slices shown (2 of 2)]
[im 1/28]
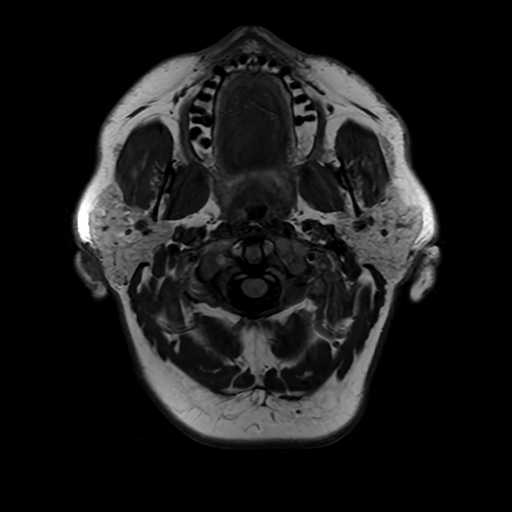
[im 28/28]
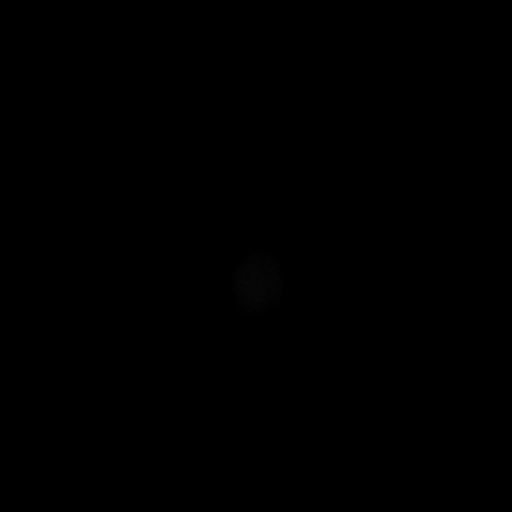

[Series 250: ADC · axial · 3.0mm · 0.94mm/px · z∈[-108,+52]mm · 4 of 55 slices shown (1 of 2)]
[im 1/55]
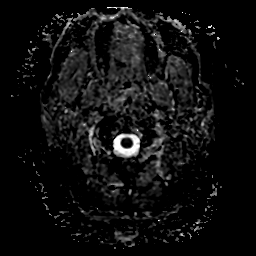
[im 19/55]
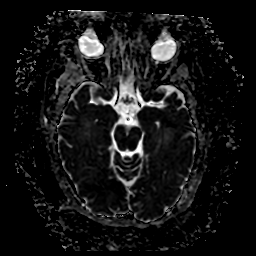
[im 37/55]
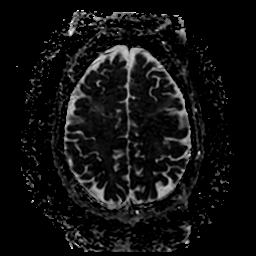
[im 55/55]
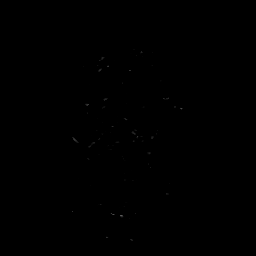

[Series 350: ADC · coronal · 4.0mm · 0.94mm/px · 2 of 38 slices shown (2 of 2)]
[im 1/38]
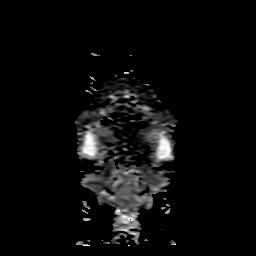
[im 38/38]
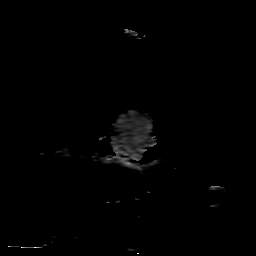

[23 of 48 positions shown; findings below may reference images not displayed]

FINDINGS: Brain: No acute infarct, mass effect or extra-axial collection. No
acute or chronic hemorrhage. Normal white matter signal, parenchymal
volume and CSF spaces. The midline structures are normal. There is
no abnormal contrast enhancement.

Vascular: Major flow voids are preserved.

Skull and upper cervical spine: Normal calvarium and skull base.
Visualized upper cervical spine and soft tissues are normal.

Sinuses/Orbits:Bilateral maxillary sinus mucosal disease. Normal
orbits.
IMPRESSION: Normal brain MRI.

## 2023-02-07 IMAGING — MR MR CERVICAL SPINE WO/W CM
5 of 8 series · 19 of 48 positions shown · IV contrast (7.5 m GAD)
Comparison: None

CLINICAL DATA: Dizziness and concern for demyelinating disease.

EXAM:
MRI CERVICAL SPINE WITHOUT AND WITH CONTRAST
TECHNIQUE: Multiplanar and multiecho pulse sequences of the cervical spine, to
include the craniocervical junction and cervicothoracic junction,
were obtained without and with intravenous contrast.
CONTRAST:  7.5mL GADAVIST GADOBUTROL 1 MMOL/ML IV SOLN, 7.5mL
GADAVIST GADOBUTROL 1 MMOL/ML IV SOLN

[Series 10: T2 · sagittal · 3.0mm · 0.35mm/px · 3 of 18 slices shown (1 of 2)]
[im 1/18]
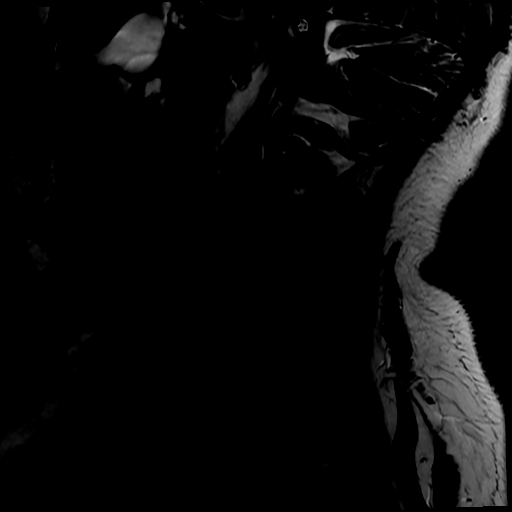
[im 9/18]
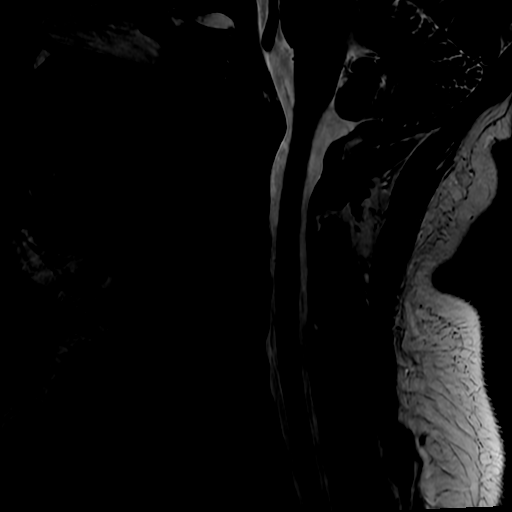
[im 18/18]
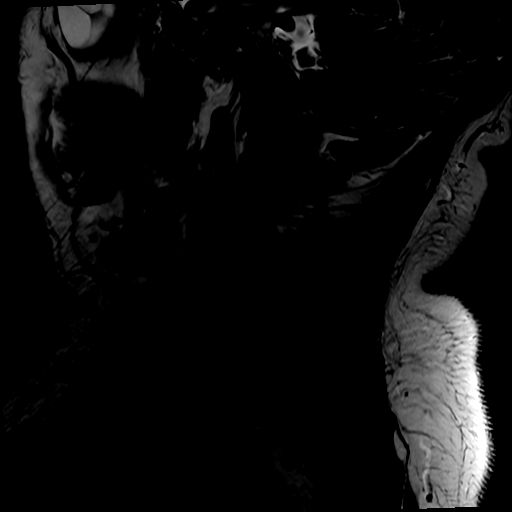

[Series 12: STIR · sagittal · 3.0mm · 0.35mm/px · 1 of 18 slices shown]
[im 1/18]
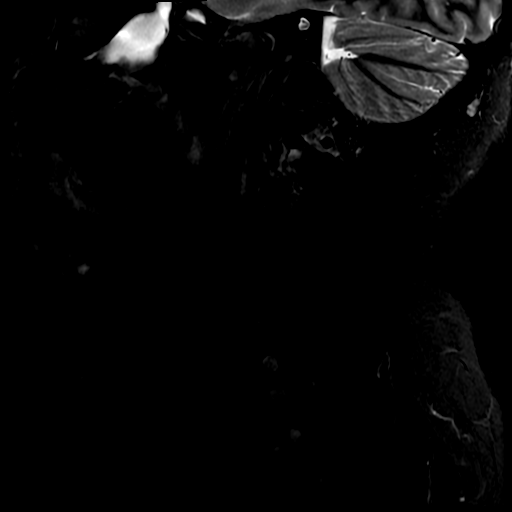

[Series 14: T2 · axial · 3.0mm · 0.35mm/px · z∈[-224,-114]mm · 6 of 36 slices shown (2 of 2)]
[im 1/36]
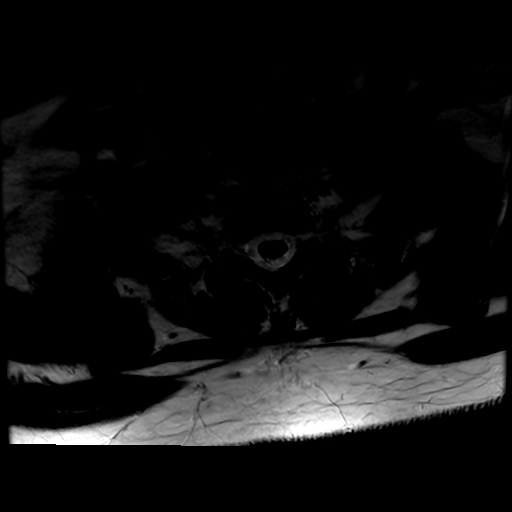
[im 8/36]
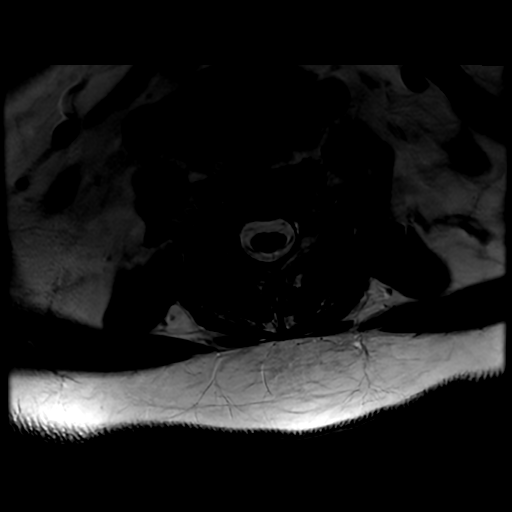
[im 15/36]
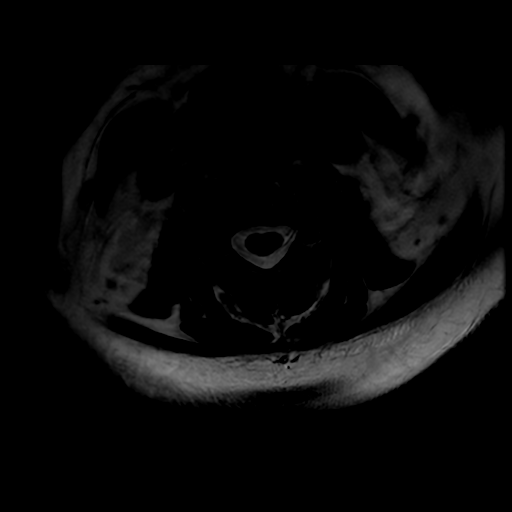
[im 22/36]
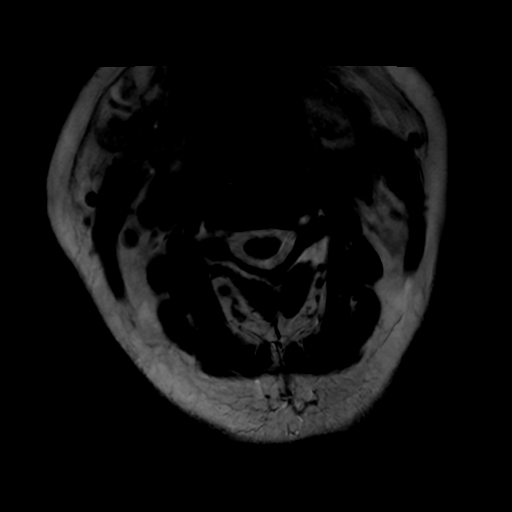
[im 29/36]
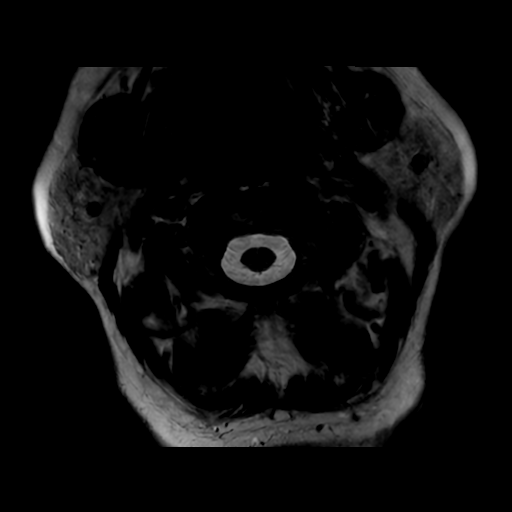
[im 36/36]
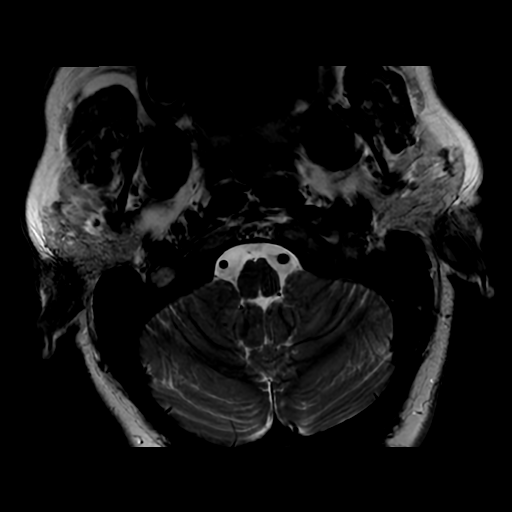

[Series 15: T1 · axial · non-contrast · 3.0mm · 0.35mm/px · z∈[-224,-114]mm · 6 of 36 slices shown]
[im 1/36]
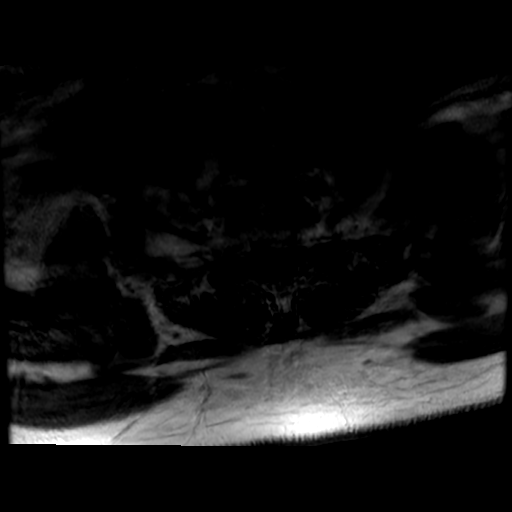
[im 8/36]
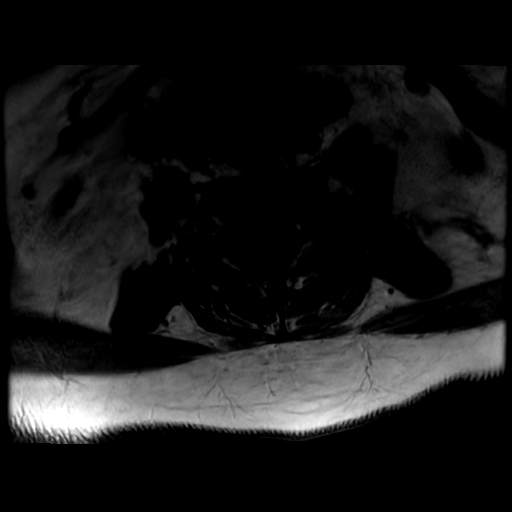
[im 15/36]
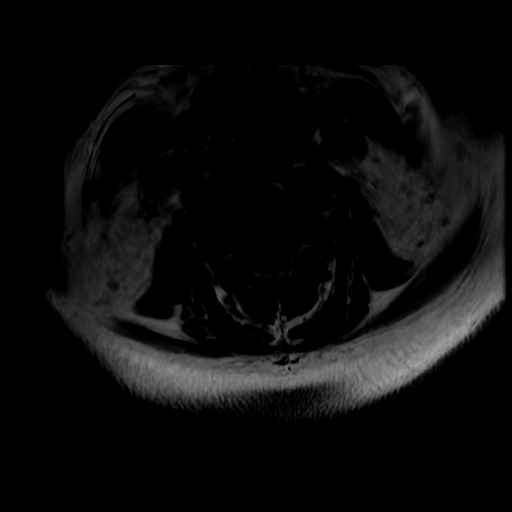
[im 22/36]
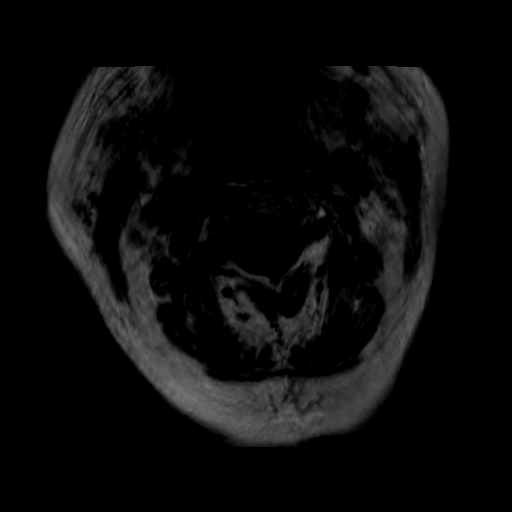
[im 29/36]
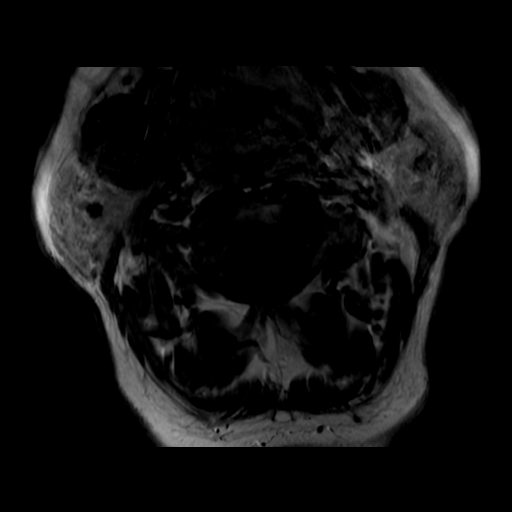
[im 36/36]
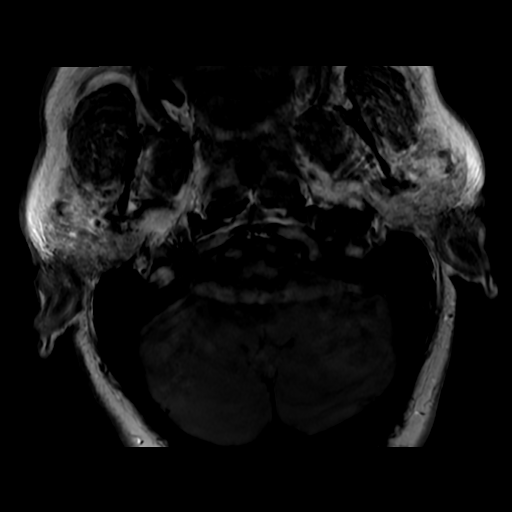

[Series 16: T1 fat-sat post-contrast · sagittal · 3.0mm · 0.35mm/px · 3 of 18 slices shown]
[im 1/18]
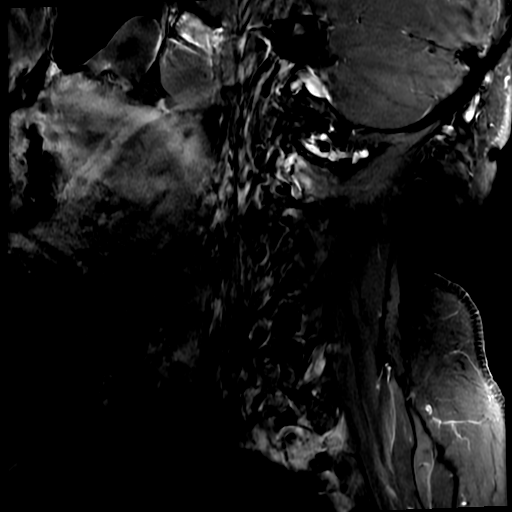
[im 9/18]
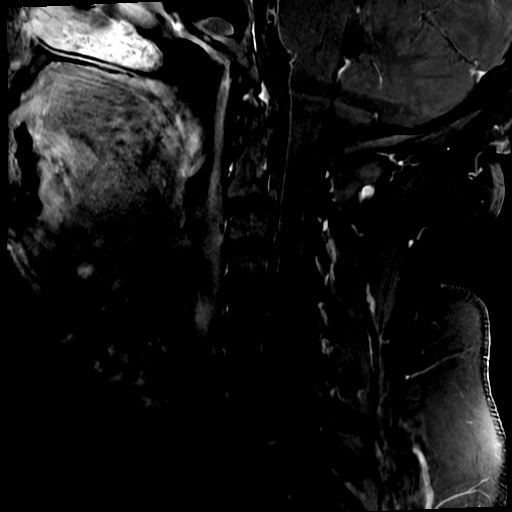
[im 18/18]
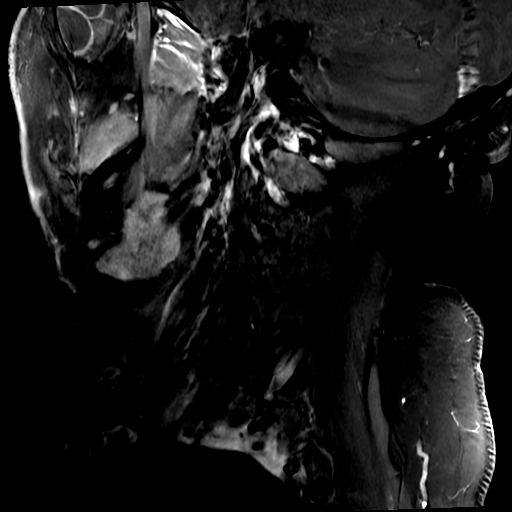

[19 of 48 positions shown; findings below may reference images not displayed]

FINDINGS: Alignment: Normal

Vertebrae: No fracture, evidence of discitis, or bone lesion.

Cord: Normal signal and morphology.

Posterior Fossa, vertebral arteries, paraspinal tissues: Negative

Disc levels:

C1-2: Unremarkable.

C2-3: Normal disc space and facet joints. There is no spinal canal
stenosis. No neural foraminal stenosis.

C3-4: Normal disc space and facet joints. There is no spinal canal
stenosis. No neural foraminal stenosis.

C4-5: Normal disc space and facet joints. There is no spinal canal
stenosis. No neural foraminal stenosis.

C5-6: Right uncovertebral hypertrophy. There is no spinal canal
stenosis. Mild right neural foraminal stenosis.

C6-7: Normal disc space and facet joints. There is no spinal canal
stenosis. No neural foraminal stenosis.

C7-T1: Normal disc space and facet joints. There is no spinal canal
stenosis. No neural foraminal stenosis.
IMPRESSION: 1. No evidence of demyelinating disease of the cervical spine.
2. Mild right C5-6 neural foraminal stenosis.

## 2023-02-09 ENCOUNTER — Encounter (INDEPENDENT_AMBULATORY_CARE_PROVIDER_SITE_OTHER): Payer: Self-pay

## 2023-02-09 ENCOUNTER — Ambulatory Visit (INDEPENDENT_AMBULATORY_CARE_PROVIDER_SITE_OTHER): Payer: Commercial Managed Care - HMO

## 2023-02-09 VITALS — Ht 61.0 in | Wt 162.0 lb

## 2023-02-09 DIAGNOSIS — J320 Chronic maxillary sinusitis: Secondary | ICD-10-CM

## 2023-02-09 DIAGNOSIS — R0981 Nasal congestion: Secondary | ICD-10-CM | POA: Diagnosis not present

## 2023-02-09 DIAGNOSIS — J338 Other polyp of sinus: Secondary | ICD-10-CM

## 2023-02-09 DIAGNOSIS — J321 Chronic frontal sinusitis: Secondary | ICD-10-CM

## 2023-02-09 DIAGNOSIS — J3489 Other specified disorders of nose and nasal sinuses: Secondary | ICD-10-CM | POA: Diagnosis not present

## 2023-02-09 DIAGNOSIS — J322 Chronic ethmoidal sinusitis: Secondary | ICD-10-CM

## 2023-02-09 NOTE — Progress Notes (Unsigned)
Patient ID: Amanda Gamble, female   DOB: 10-Nov-1970, 52 y.o.   MRN: [id number]  Procedure: Bilateral nasal/sinus debridement status post endoscopic sinus surgery.   Indication: The patient previously underwent bilateral endoscopic sinus surgery to treat her chronic maxillary, ethmoid, and frontal sinusitis and polyposis.  The patient returns today reporting improvement in her nasal congestion and crusting.  She had no significant bleeding from the nasal cavities.  Currently the patient denies any facial pain, fever, or visual change.   Anesthesia: Topical Xylocaine and Neo-Synephrine.   Description: The patient is placed upright in the exam chair. Both nasal cavities are sprayed with topical Xylocaine and Neo-Synephrine.  A 0 rigid endoscope is used for the examination. The scope is first advanced past the right nostril into the right nasal cavity. A moderate amount of crusting is noted within the right nasal cavity and the maxillary/ethmoid cavities.  The crusting is removed with suction catheters and alligator forceps, which are inserted in parallel with the rigid endoscope.  After the debridement procedure, the maxillary antrum and the ethmoid cavities are noted to be widely patent.  The frontal sinus was also patent.  The same procedure is then repeated on the left side with similar findings. The patient tolerated the procedure well.   Follow up care: The patient is instructed to perform as needed nasal saline irrigation.  The patient will return for re-evaluation in approximately 4 weeks.

## 2023-03-15 ENCOUNTER — Ambulatory Visit (INDEPENDENT_AMBULATORY_CARE_PROVIDER_SITE_OTHER): Payer: Commercial Managed Care - HMO

## 2023-03-29 ENCOUNTER — Ambulatory Visit (INDEPENDENT_AMBULATORY_CARE_PROVIDER_SITE_OTHER): Payer: Commercial Managed Care - HMO

## 2023-04-09 ENCOUNTER — Telehealth (INDEPENDENT_AMBULATORY_CARE_PROVIDER_SITE_OTHER): Payer: Self-pay | Admitting: Otolaryngology

## 2023-04-09 NOTE — Telephone Encounter (Signed)
Confirmed appt and address for 04/12/2023.

## 2023-04-12 ENCOUNTER — Ambulatory Visit (INDEPENDENT_AMBULATORY_CARE_PROVIDER_SITE_OTHER): Payer: Commercial Managed Care - HMO

## 2023-04-12 ENCOUNTER — Encounter (INDEPENDENT_AMBULATORY_CARE_PROVIDER_SITE_OTHER): Payer: Self-pay
